# Patient Record
Sex: Male | Born: 1975 | Race: Black or African American | Hispanic: No | Marital: Single | Smoking: Never smoker
Health system: Southern US, Community
[De-identification: ages and names within clinical notes are randomized; demographics above are authoritative.]

## PROBLEM LIST (undated history)

## (undated) DIAGNOSIS — R569 Unspecified convulsions: Secondary | ICD-10-CM

## (undated) DIAGNOSIS — R42 Dizziness and giddiness: Secondary | ICD-10-CM

## (undated) DIAGNOSIS — R519 Headache, unspecified: Secondary | ICD-10-CM

## (undated) HISTORY — DX: Headache, unspecified: R51.9

## (undated) HISTORY — DX: Dizziness and giddiness: R42

---

## 2007-12-15 ENCOUNTER — Ambulatory Visit: Payer: Self-pay | Admitting: Family Medicine

## 2007-12-15 DIAGNOSIS — R002 Palpitations: Secondary | ICD-10-CM

## 2007-12-15 DIAGNOSIS — R569 Unspecified convulsions: Secondary | ICD-10-CM

## 2007-12-15 DIAGNOSIS — Z9189 Other specified personal risk factors, not elsewhere classified: Secondary | ICD-10-CM | POA: Insufficient documentation

## 2007-12-18 LAB — CONVERTED CEMR LAB
Basophils Absolute: 0 10*3/uL (ref 0.0–0.1)
CO2: 28 meq/L (ref 19–32)
Chloride: 102 meq/L (ref 96–112)
Cholesterol: 162 mg/dL (ref 0–200)
Eosinophils Absolute: 0.2 10*3/uL (ref 0.0–0.7)
Eosinophils Relative: 3.5 % (ref 0.0–5.0)
Hemoglobin: 17.2 g/dL — ABNORMAL HIGH (ref 13.0–17.0)
LDL Cholesterol: 118 mg/dL — ABNORMAL HIGH (ref 0–99)
MCV: 93.6 fL (ref 78.0–100.0)
Monocytes Absolute: 0.6 10*3/uL (ref 0.1–1.0)
Neutro Abs: 2.5 10*3/uL (ref 1.4–7.7)
Platelets: 210 10*3/uL (ref 150–400)
Potassium: 4.5 meq/L (ref 3.5–5.1)
RBC: 5.34 M/uL (ref 4.22–5.81)
Sodium: 140 meq/L (ref 135–145)
Total Bilirubin: 0.6 mg/dL (ref 0.3–1.2)
Total CHOL/HDL Ratio: 6
Triglycerides: 83 mg/dL (ref 0–149)
VLDL: 17 mg/dL (ref 0–40)
WBC: 5.1 10*3/uL (ref 4.5–10.5)

## 2009-12-15 ENCOUNTER — Emergency Department (HOSPITAL_COMMUNITY): Admission: EM | Admit: 2009-12-15 | Discharge: 2009-12-15 | Payer: Self-pay | Admitting: Emergency Medicine

## 2010-09-28 LAB — GC/CHLAMYDIA PROBE AMP, GENITAL
Chlamydia, DNA Probe: NEGATIVE
GC Probe Amp, Genital: NEGATIVE

## 2010-09-28 LAB — RPR: RPR Ser Ql: NONREACTIVE

## 2010-09-28 LAB — URINALYSIS, ROUTINE W REFLEX MICROSCOPIC
Bilirubin Urine: NEGATIVE
Glucose, UA: NEGATIVE mg/dL
Hgb urine dipstick: NEGATIVE
Ketones, ur: NEGATIVE mg/dL
Nitrite: NEGATIVE

## 2015-09-29 ENCOUNTER — Emergency Department (HOSPITAL_BASED_OUTPATIENT_CLINIC_OR_DEPARTMENT_OTHER)
Admission: EM | Admit: 2015-09-29 | Discharge: 2015-09-29 | Disposition: A | Discharge status: Home / Self Care | Payer: Self-pay | Attending: Emergency Medicine | Admitting: Emergency Medicine

## 2015-09-29 ENCOUNTER — Emergency Department (HOSPITAL_BASED_OUTPATIENT_CLINIC_OR_DEPARTMENT_OTHER): Payer: Self-pay

## 2015-09-29 ENCOUNTER — Encounter (HOSPITAL_BASED_OUTPATIENT_CLINIC_OR_DEPARTMENT_OTHER): Payer: Self-pay | Admitting: *Deleted

## 2015-09-29 DIAGNOSIS — Y998 Other external cause status: Secondary | ICD-10-CM | POA: Insufficient documentation

## 2015-09-29 DIAGNOSIS — Y92310 Basketball court as the place of occurrence of the external cause: Secondary | ICD-10-CM | POA: Insufficient documentation

## 2015-09-29 DIAGNOSIS — Y9367 Activity, basketball: Secondary | ICD-10-CM | POA: Insufficient documentation

## 2015-09-29 DIAGNOSIS — S93401A Sprain of unspecified ligament of right ankle, initial encounter: Secondary | ICD-10-CM | POA: Insufficient documentation

## 2015-09-29 DIAGNOSIS — X501XXA Overexertion from prolonged static or awkward postures, initial encounter: Secondary | ICD-10-CM | POA: Insufficient documentation

## 2015-09-29 HISTORY — DX: Unspecified convulsions: R56.9

## 2015-09-29 MED ORDER — NAPROXEN 500 MG PO TABS: 500.0000 mg | ORAL_TABLET | Freq: Two times a day (BID) | ORAL | Status: DC

## 2015-09-29 MED ORDER — IBUPROFEN 800 MG PO TABS
800.0000 mg | ORAL_TABLET | Freq: Once | ORAL | Status: AC
  Administered 2015-09-29: 800 mg via ORAL
  Filled 2015-09-29: qty 1

## 2015-09-29 NOTE — ED Notes (Signed)
Pt tolerated well. CMS intact before and after. Pt understood how to use crutches properly. No questions from pt.

## 2015-09-29 NOTE — ED Notes (Signed)
[facility]

## 2015-09-29 NOTE — ED Provider Notes (Signed)
CSN: [id number]     Arrival date & time 09/29/15  0421 History   First MD Initiated Contact with Patient 09/29/15 [phone]     Chief Complaint  Patient presents with  . Ankle Pain     (Consider location/radiation/quality/duration/timing/severity/associated sxs/prior Treatment) Patient is a 40 y.o. male presenting with ankle pain. The history is provided by the patient.  Ankle Pain Location:  Ankle Time since incident:  1 day Injury: yes   Mechanism of injury comment:  Twisted it playing basketball Ankle location:  R ankle Pain details:    Quality:  Aching   Radiates to:  Does not radiate   Severity:  Severe   Onset quality:  Sudden   Timing:  Constant   Progression:  Unchanged Chronicity:  New Dislocation: no   Foreign body present:  No foreign bodies Prior injury to area:  No Relieved by:  Nothing Worsened by:  Nothing tried Ineffective treatments:  None tried Associated symptoms: no back pain, no muscle weakness, no numbness and no stiffness   Risk factors: no concern for non-accidental trauma     Past Medical History  Diagnosis Date  . Seizures (HCC)    History reviewed. No pertinent past surgical history. History reviewed. No pertinent family history. Social History  Substance Use Topics  . Smoking status: Never Smoker   . Smokeless tobacco: None  . Alcohol Use: Yes     Comment: occ    Review of Systems  Musculoskeletal: Negative for back pain and stiffness.  All other systems reviewed and are negative.     Allergies  Review of patient's allergies indicates no known allergies.  Home Medications   Prior to Admission medications   Not on File   BP 110/65 mmHg  Pulse 82  Temp(Src) 99 F (37.2 C) (Oral)  Resp 16  Ht  (1.753 m)  Wt 180 lb (81.647 kg)  BMI 26.57 kg/m2  SpO2 99% Physical Exam  Constitutional: He is oriented to person, place, and time. He appears well-developed and well-nourished. No distress.  HENT:  Head: Normocephalic and  atraumatic.  Mouth/Throat: Oropharynx is clear and moist.  Eyes: Pupils are equal, round, and reactive to light.  Neck: Normal range of motion. Neck supple.  Cardiovascular: Normal rate, regular rhythm and intact distal pulses.   Pulmonary/Chest: Effort normal and breath sounds normal. No respiratory distress. He has no wheezes. He has no rales.  Abdominal: Soft. Bowel sounds are normal. There is no tenderness. There is no rebound and no guarding.  Musculoskeletal: Normal range of motion.       Right ankle: He exhibits normal range of motion, no swelling, no ecchymosis, no deformity, no laceration and normal pulse. Tenderness. AITFL tenderness found. No medial malleolus, no CF ligament, no posterior TFL, no head of 5th metatarsal and no proximal fibula tenderness found. Achilles tendon normal.       Right foot: Normal.  Neurological: He is alert and oriented to person, place, and time.  Skin: Skin is warm and dry.  Psychiatric: He has a normal mood and affect.    ED Course  Procedures (including critical care time) Labs Review Labs Reviewed - No data to display  Imaging Review No results found. I have personally reviewed and evaluated these images and lab results as part of my medical decision-making.   EKG Interpretation None      MDM   Final diagnoses:  None   Ankle Sprain.  ASO crutches ice elevation and NSAIDS.  Cy BlamerApril Isobel Eisenhuth, MD 09/29/15 [phone]

## 2015-09-29 NOTE — Discharge Instructions (Signed)
Ankle Sprain °An ankle sprain is an injury to the strong, fibrous tissues (ligaments) that hold the bones of your ankle joint together.  °CAUSES °An ankle sprain is usually caused by a fall or by twisting your ankle. Ankle sprains most commonly occur when you step on the outer edge of your foot, and your ankle turns inward. People who participate in sports are more prone to these types of injuries.  °SYMPTOMS  °· Pain in your ankle. The pain may be present at rest or only when you are trying to stand or walk. °· Swelling. °· Bruising. Bruising may develop immediately or within 1 to 2 days after your injury. °· Difficulty standing or walking, particularly when turning corners or changing directions. °DIAGNOSIS  °Your caregiver will ask you details about your injury and perform a physical exam of your ankle to determine if you have an ankle sprain. During the physical exam, your caregiver will press on and apply pressure to specific areas of your foot and ankle. Your caregiver will try to move your ankle in certain ways. An X-ray exam may be done to be sure a bone was not broken or a ligament did not separate from one of the bones in your ankle (avulsion fracture).  °TREATMENT  °Certain types of braces can help stabilize your ankle. Your caregiver can make a recommendation for this. Your caregiver may recommend the use of medicine for pain. If your sprain is severe, your caregiver may refer you to a surgeon who helps to restore function to parts of your skeletal system (orthopedist) or a physical therapist. °HOME CARE INSTRUCTIONS  °· Apply ice to your injury for 1-2 days or as directed by your caregiver. Applying ice helps to reduce inflammation and pain. °· Put ice in a plastic bag. °· Place a towel between your skin and the bag. °· Leave the ice on for 15-20 minutes at a time, every 2 hours while you are awake. °· Only take over-the-counter or prescription medicines for pain, discomfort, or fever as directed by  your caregiver. °· Elevate your injured ankle above the level of your heart as much as possible for 2-3 days. °· If your caregiver recommends crutches, use them as instructed. Gradually put weight on the affected ankle. Continue to use crutches or a cane until you can walk without feeling pain in your ankle. °· If you have a plaster splint, wear the splint as directed by your caregiver. Do not rest it on anything harder than a pillow for the first 24 hours. Do not put weight on it. Do not get it wet. You may take it off to take a shower or bath. °· You may have been given an elastic bandage to wear around your ankle to provide support. If the elastic bandage is too tight (you have numbness or tingling in your foot or your foot becomes cold and blue), adjust the bandage to make it comfortable. °· If you have an air splint, you may blow more air into it or let air out to make it more comfortable. You may take your splint off at night and before taking a shower or bath. Wiggle your toes in the splint several times per day to decrease swelling. °SEEK MEDICAL CARE IF:  °· You have rapidly increasing bruising or swelling. °· Your toes feel extremely cold or you lose feeling in your foot. °· Your pain is not relieved with medicine. °SEEK IMMEDIATE MEDICAL CARE IF: °· Your toes are numb or blue. °·   You have severe pain that is increasing. °MAKE SURE YOU:  °· Understand these instructions. °· Will watch your condition. °· Will get help right away if you are not doing well or get worse. °  °This information is not intended to replace advice given to you by your health care provider. Make sure you discuss any questions you have with your health care provider. °  °Document Released: 06/28/2005 Document Revised: 07/19/2014 Document Reviewed: 07/10/2011 °Elsevier Interactive Patient Education ©2016 [employer] ° °Cryotherapy °Cryotherapy means treatment with cold. Ice or gel packs can be used to reduce both pain and swelling.  Ice is the most helpful within the first 24 to 48 hours after an injury or flare-up from overusing a muscle or joint. Sprains, strains, spasms, burning pain, shooting pain, and aches can all be eased with ice. Ice can also be used when recovering from surgery. Ice is effective, has very few side effects, and is safe for most people to use. °PRECAUTIONS  °Ice is not a safe treatment option for people with: °· Raynaud phenomenon. This is a condition affecting small blood vessels in the extremities. Exposure to cold may cause your problems to return. °· Cold hypersensitivity. There are many forms of cold hypersensitivity, including: °¨ Cold urticaria. Red, itchy hives appear on the skin when the tissues begin to warm after being iced. °¨ Cold erythema. This is a red, itchy rash caused by exposure to cold. °¨ Cold hemoglobinuria. Red blood cells break down when the tissues begin to warm after being iced. The hemoglobin that carry oxygen are passed into the urine because they cannot combine with blood proteins fast enough. °· Numbness or altered sensitivity in the area being iced. °If you have any of the following conditions, do not use ice until you have discussed cryotherapy with your caregiver: °· Heart conditions, such as arrhythmia, angina, or chronic heart disease. °· High blood pressure. °· Healing wounds or open skin in the area being iced. °· Current infections. °· Rheumatoid arthritis. °· Poor circulation. °· Diabetes. °Ice slows the blood flow in the region it is applied. This is beneficial when trying to stop inflamed tissues from spreading irritating chemicals to surrounding tissues. However, if you expose your skin to cold temperatures for too long or without the proper protection, you can damage your skin or nerves. Watch for signs of skin damage due to cold. °HOME CARE INSTRUCTIONS °Follow these tips to use ice and cold packs safely. °· Place a dry or damp towel between the ice and skin. A damp towel will  cool the skin more quickly, so you may need to shorten the time that the ice is used. °· For a more rapid response, add gentle compression to the ice. °· Ice for no more than 10 to 20 minutes at a time. The bonier the area you are icing, the less time it will take to get the benefits of ice. °· Check your skin after 5 minutes to make sure there are no signs of a poor response to cold or skin damage. °· Rest 20 minutes or more between uses. °· Once your skin is numb, you can end your treatment. You can test numbness by very lightly touching your skin. The touch should be so light that you do not see the skin dimple from the pressure of your fingertip. When using ice, most people will feel these normal sensations in this order: cold, burning, aching, and numbness. °· Do not use ice on someone who   cannot communicate their responses to pain, such as small children or people with dementia. °HOW TO MAKE AN ICE PACK °Ice packs are the most common way to use ice therapy. Other methods include ice massage, ice baths, and cryosprays. Muscle creams that cause a cold, tingly feeling do not offer the same benefits that ice offers and should not be used as a substitute unless recommended by your caregiver. °To make an ice pack, do one of the following: °· Place crushed ice or a bag of frozen vegetables in a sealable plastic bag. Squeeze out the excess air. Place this bag inside another plastic bag. Slide the bag into a pillowcase or place a damp towel between your skin and the bag. °· Mix 3 parts water with 1 part rubbing alcohol. Freeze the mixture in a sealable plastic bag. When you remove the mixture from the freezer, it will be slushy. Squeeze out the excess air. Place this bag inside another plastic bag. Slide the bag into a pillowcase or place a damp towel between your skin and the bag. °SEEK MEDICAL CARE IF: °· You develop white spots on your skin. This may give the skin a blotchy (mottled) appearance. °· Your skin turns  blue or pale. °· Your skin becomes waxy or hard. °· Your swelling gets worse. °MAKE SURE YOU:  °· Understand these instructions. °· Will watch your condition. °· Will get help right away if you are not doing well or get worse. °  °This information is not intended to replace advice given to you by your health care provider. Make sure you discuss any questions you have with your health care provider. °  °Document Released: 02/22/2011 Document Revised: 07/19/2014 Document Reviewed: 02/22/2011 °Elsevier Interactive Patient Education ©2016 [employer]vier Inc. ° °

## 2015-09-29 NOTE — ED Notes (Signed)
C/o R akle pain and swelling, rolled ankle yesterday playing basketball, c/o circumfrential ankle pain, no meds PTA, CMS and ROM intact. Has tried ice, heat and epsom salts. States, "Need crutches to work". Swelling present, skin W&D.

## 2015-09-29 NOTE — ED Notes (Signed)
Pt to xray via stretcher

## 2016-09-13 IMAGING — DX DG ANKLE COMPLETE 3+V*R*
3 series · 3 of 3 positions shown · non-contrast
Comparison: None.

CLINICAL DATA: Pain and swelling after trauma

EXAM:
RIGHT ANKLE - COMPLETE 3+ VIEW

[ankle ap]
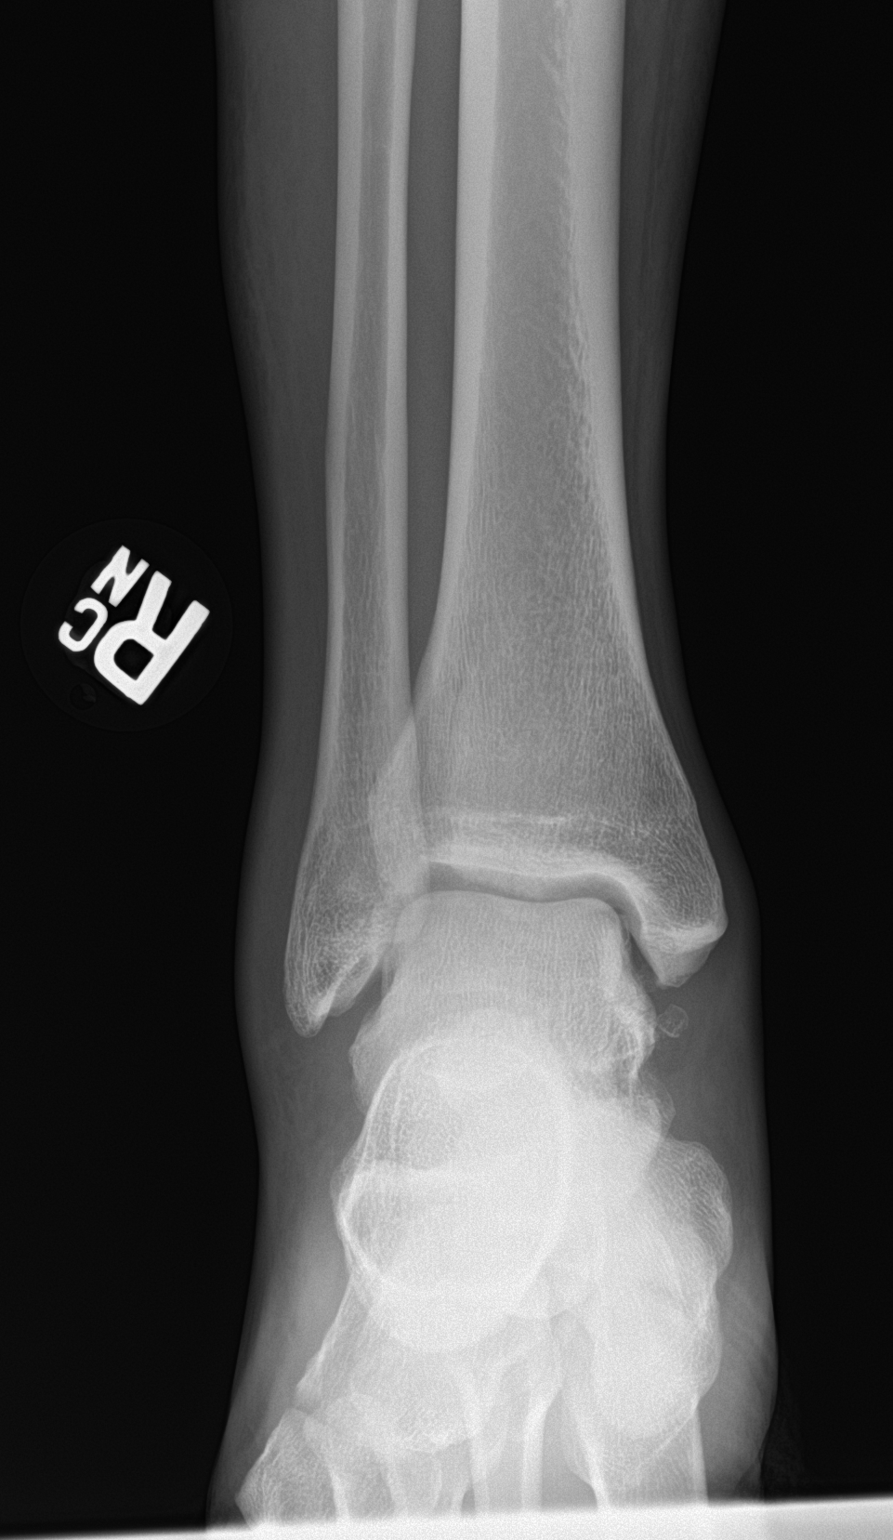

[ankle obl]
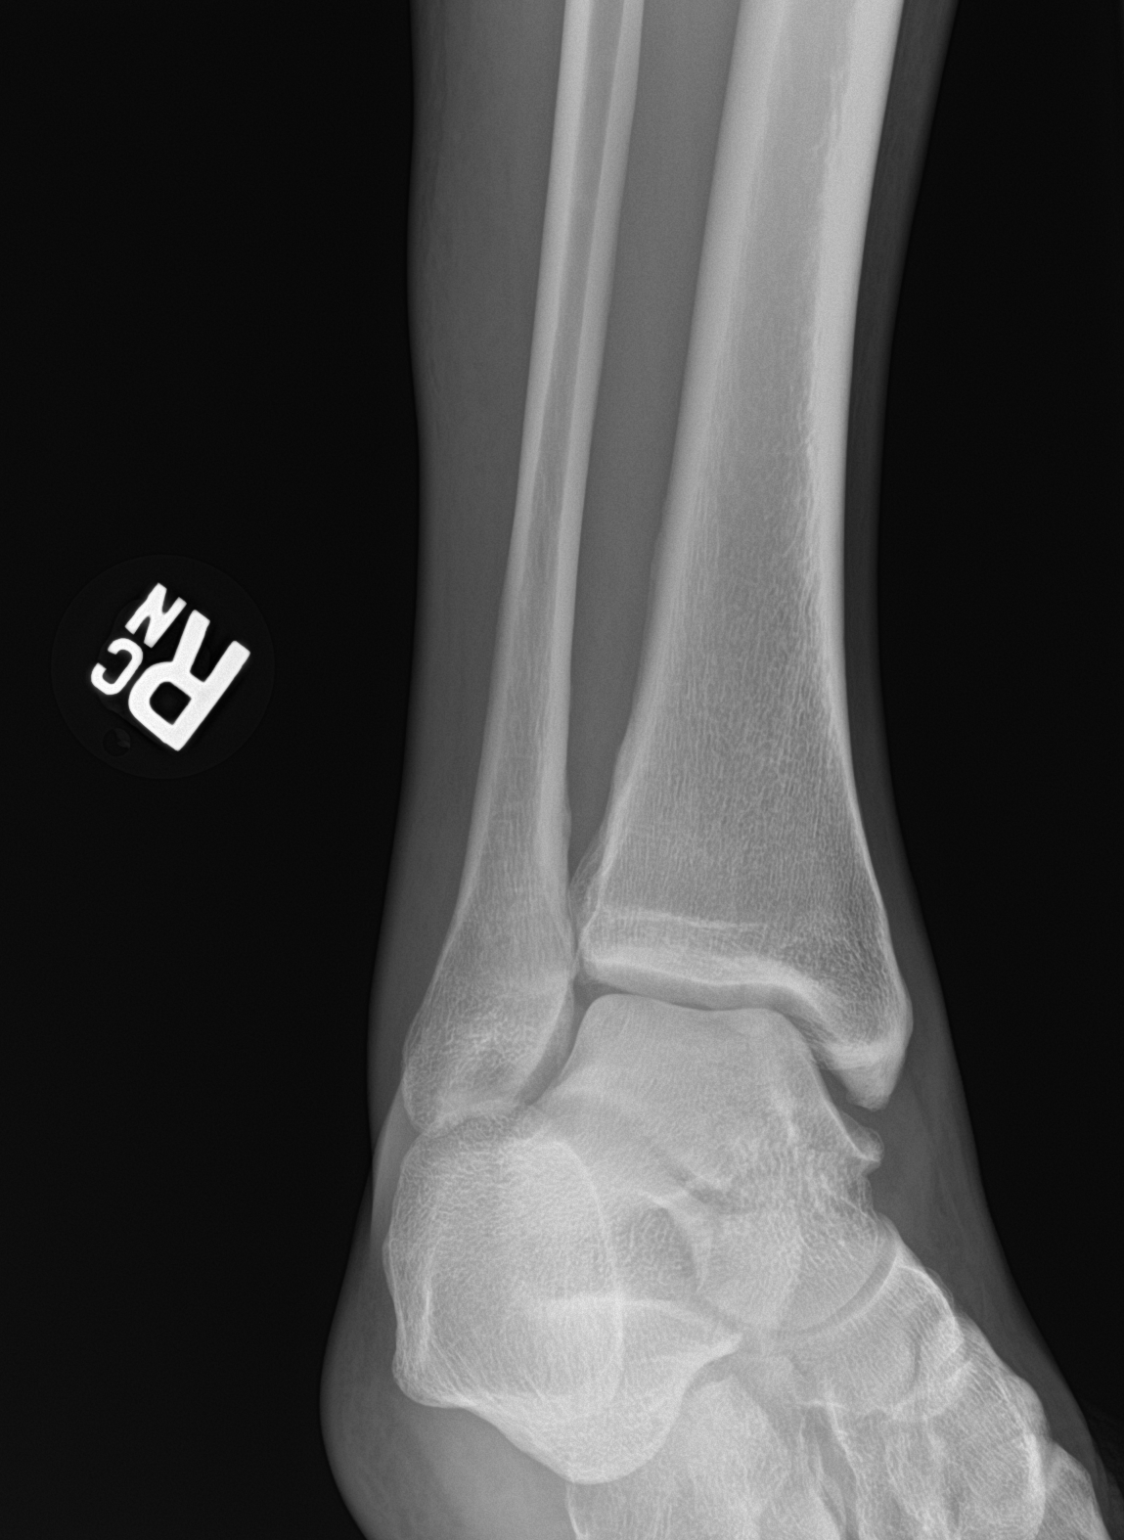

[ankle lat]
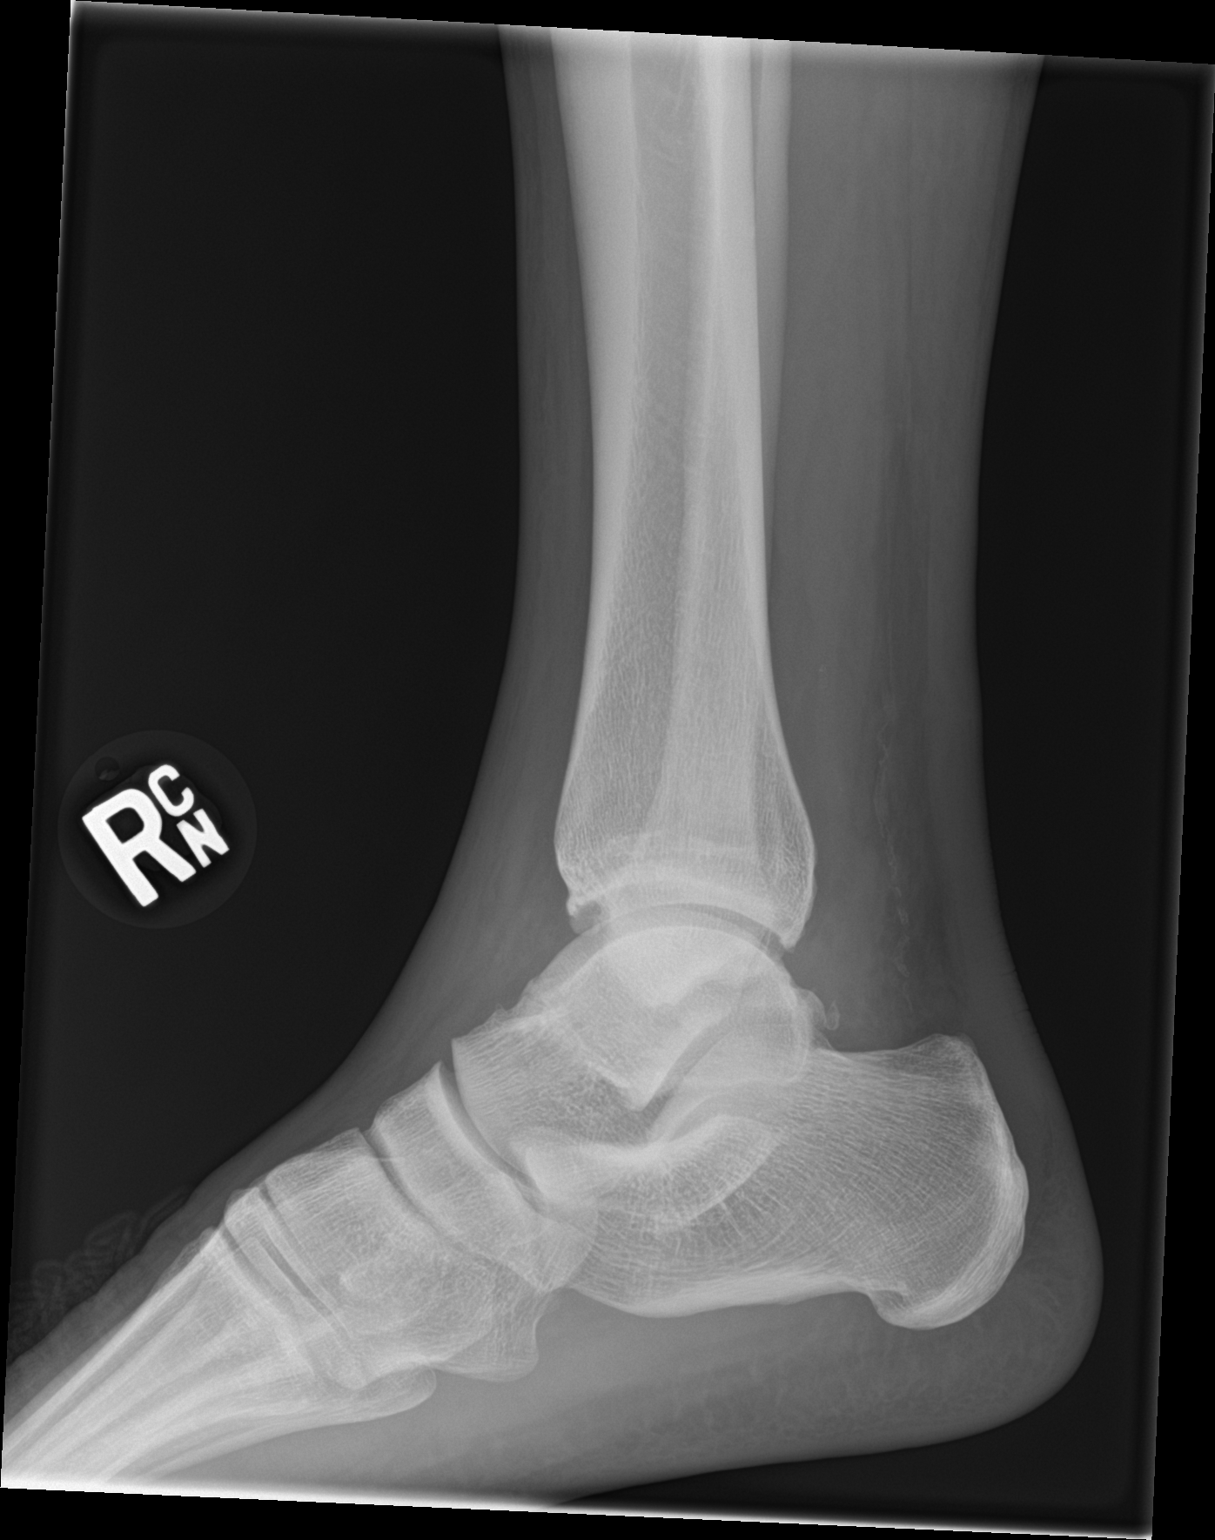

[3 of 3 positions shown; findings below may reference images not displayed]

FINDINGS: There is no evidence of fracture, dislocation, or joint effusion.
There is no evidence of arthropathy or other focal bone abnormality.
Soft tissues are unremarkable.
IMPRESSION: Negative.

## 2017-09-28 ENCOUNTER — Other Ambulatory Visit: Payer: Self-pay

## 2017-09-28 ENCOUNTER — Emergency Department (HOSPITAL_BASED_OUTPATIENT_CLINIC_OR_DEPARTMENT_OTHER)
Admission: EM | Admit: 2017-09-28 | Discharge: 2017-09-28 | Disposition: A | Discharge status: Home / Self Care | Payer: Self-pay | Attending: Emergency Medicine | Admitting: Emergency Medicine

## 2017-09-28 ENCOUNTER — Encounter (HOSPITAL_BASED_OUTPATIENT_CLINIC_OR_DEPARTMENT_OTHER): Payer: Self-pay

## 2017-09-28 DIAGNOSIS — R300 Dysuria: Secondary | ICD-10-CM | POA: Insufficient documentation

## 2017-09-28 DIAGNOSIS — G40909 Epilepsy, unspecified, not intractable, without status epilepticus: Secondary | ICD-10-CM | POA: Insufficient documentation

## 2017-09-28 DIAGNOSIS — Z79899 Other long term (current) drug therapy: Secondary | ICD-10-CM | POA: Insufficient documentation

## 2017-09-28 LAB — URINALYSIS, ROUTINE W REFLEX MICROSCOPIC
Bilirubin Urine: NEGATIVE
GLUCOSE, UA: NEGATIVE mg/dL
Hgb urine dipstick: NEGATIVE
KETONES UR: 15 mg/dL — AB
LEUKOCYTES UA: NEGATIVE
NITRITE: NEGATIVE
PH: 8.5 — AB (ref 5.0–8.0)
PROTEIN: 30 mg/dL — AB
Specific Gravity, Urine: 1.015 (ref 1.005–1.030)

## 2017-09-28 LAB — URINALYSIS, MICROSCOPIC (REFLEX): RBC / HPF: NONE SEEN RBC/hpf (ref 0–5)

## 2017-09-28 NOTE — ED Triage Notes (Signed)
Pt states he feels like he is going to have a seizure-NAD-steady gait-pt states he stopped taking his seizure med because he thinks he is having "negative reactions-real scared and shaky"

## 2017-09-28 NOTE — ED Provider Notes (Signed)
[facility] EMERGENCY DEPARTMENT Provider Note   CSN: [id number] Arrival date & time: 09/28/17  1330     History   Chief Complaint Chief Complaint  Patient presents with  . Seizures    HPI Eduardo Moreno is a 42 y.o. male.  HPI 42 year old African-American male past medical history significant for seizure disorder presents to the emergency department with very vague complaints.  Patient states that he stopped his seizure medication 2 days ago because "he thinks he is allergic to it because it makes him feel real scared and shaky".  Patient states that he feels like he might have a seizure.  Patient states that he has been on seizure medication for the past 10 years.  States that he stopped this 2 days ago due to his reaction.  Patient has not followed up with his primary care doctor.  States he has an appointment in a couple weeks.  The patient states he does not see a neurologist for his seizures.  Patient states that "I feel all funny in my head".  Patient also starts complaining that he is worried about his prostate he may have prostate cancer that spread to his head.  Patient takes "I have seen a prostate doctor in the past that was normal workup".  Patient states he is very worried that he has cancer somewhere and that "something just is not right.  Patient reports that he does have some burning when he pees intermittently that is been going on for several months to years.  He denies any associated penile discharge, testicular pain or swelling, abdominal pain.  Denies any associated fevers, chills.  Denies any pain with defecation.  Denies any blood in his stools.  Of note patient was seen on 3/15 for same symptoms with associated dizziness and confusion.  At that time he had a very thorough workup that included blood work, urine, UDS, CT of head, MRI of head, EKG and chest x-ray that was unremarkable for any acute findings.  The diagnosis of vertigo and put him on meclizine.   Patient states that his dizziness has improved.  He states that his symptoms do seem to be improving after stopping the seizure medication.    States that he does not have insurance and cannot go see any specialist.  Pt denies any fever, chill, ha, vision changes, lightheadedness, dizziness, congestion, neck pain, cp, sob, cough, abd pain, n/v/d, change in bowel habits, melena, hematochezia, lower extremity paresthesias.  Past Medical History:  Diagnosis Date  . Seizures [facility])     Patient Active Problem List   Diagnosis Date Noted  . SEIZURE DISORDER 12/15/2007  . PALPITATIONS 12/15/2007  . CHICKENPOX, HX OF 12/15/2007    History reviewed. No pertinent surgical history.     Home Medications    Prior to Admission medications   Medication Sig Start Date End Date Taking? Authorizing Provider  carbamazepine (TEGRETOL) 200 MG tablet Take 200 mg by mouth 3 (three) times daily.   Yes [provider]    Family History No family history on file.  Social History Social History   Tobacco Use  . Smoking status: Never Smoker  . Smokeless tobacco: Never Used  Substance Use Topics  . Alcohol use: Yes    Comment: occ  . Drug use: No     Allergies   Patient has no known allergies.   Review of Systems Review of Systems  All other systems reviewed and are negative.    Physical Exam  Updated Vital Signs BP 117/89 (BP Location: Left Arm)   Pulse 68   Temp 98.6 F (37 C) (Oral)   Resp 16   Ht 5\' 10"  (1.778 m)   Wt 81.6 kg (179 lb 12.8 oz)   SpO2 98%   BMI 25.80 kg/m   Physical Exam  Constitutional: He is oriented to person, place, and time. He appears well-developed and well-nourished.  Non-toxic appearance. No distress.  HENT:  Head: Normocephalic and atraumatic.  Mouth/Throat: Oropharynx is clear and moist.  Eyes: Conjunctivae and EOM are normal. Pupils are equal, round, and reactive to light. Right eye exhibits no discharge. Left eye exhibits no  discharge.  Neck: Normal range of motion. Neck supple.  Cardiovascular: Normal rate, regular rhythm, normal heart sounds and intact distal pulses. Exam reveals no gallop and no friction rub.  No murmur heard. Pulmonary/Chest: Effort normal and breath sounds normal. No stridor. No respiratory distress. He has no wheezes. He has no rales. He exhibits no tenderness.  Abdominal: Soft. Bowel sounds are normal. He exhibits no distension. There is no tenderness. There is no rebound and no guarding.  Genitourinary:  Genitourinary Comments: Rectal exam was performed with chaperone to assess for prostate.  There is no bogginess of the prostate.  It is very uniform in nature.  There is no tenderness to palpation.  No significant enlarged prostate noted.  There is no erythema around the rectum or area of fluctuance concerning for a perirectal abscess.  Stool is brown and atrial any gross melena or hematochezia.  Musculoskeletal: Normal range of motion. He exhibits no tenderness.  Lymphadenopathy:    He has no cervical adenopathy.  Neurological: He is alert and oriented to person, place, and time.  The patient is alert, attentive, and oriented x 3. Speech is clear. Cranial nerve II-VII grossly intact. Negative pronator drift. Sensation intact. Strength 5/5 in all extremities. Reflexes 2+ and symmetric at biceps, triceps, knees, and ankles. Rapid alternating movement and fine finger movements intact. Romberg is absent. Posture and gait normal.   Skin: Skin is warm and dry. Capillary refill takes less than 2 seconds. No rash noted.  Psychiatric: His behavior is normal. Judgment and thought content normal.  Nursing note and vitals reviewed.    ED Treatments / Results  Labs (all labs ordered are listed, but only abnormal results are displayed) Labs Reviewed  URINALYSIS, ROUTINE W REFLEX MICROSCOPIC - Abnormal; Notable for the following components:      Result Value   APPearance CLOUDY (*)    pH 8.5 (*)      Ketones, ur 15 (*)    Protein, ur 30 (*)    All other components within normal limits  URINALYSIS, MICROSCOPIC (REFLEX) - Abnormal; Notable for the following components:   Bacteria, UA FEW (*)    Squamous Epithelial / LPF 0-5 (*)    All other components within normal limits  URINE CULTURE    EKG  EKG Interpretation None       Radiology No results found.  Procedures Procedures (including critical care time)  Medications Ordered in ED Medications - No data to display   Initial Impression / Assessment and Plan / ED Course  I have reviewed the triage vital signs and the nursing notes.  Pertinent labs & imaging results that were available during my care of the patient were reviewed by me and considered in my medical decision making (see chart for details).     Patient resents to the emergency department today  for vague complaints of reaction to his seizure medication including making him feel scared and confused.  He also reports that he thinks he may have prostate cancer that has spread to his head.  Patient seems very anxious.  Of note patient had a very thorough workup on 3/15 for similar symptoms at the ED.  At that time he had an MRI and CT of head that was normal.  They diagnosed him with vertigo.  Patient is asked today that I evaluate his prostate patient is not prostate cancer.  States that he does have a history of seeing a prostate doctor.  On my exam patient's prostate is uniform without any signs of significant tenderness.  UA with rare bacteria but no other signs of infection.  Have sent for culture.  Doubt acute prostatitis.  Discussed the patient that I do not know that if he does not have prostate cancer that he is to follow-up with his primary care doctor to have further test preformed.  Patient has no focal neuro deficit on exam.  I review patient's imaging and workup from the hospital 5 days ago.  Do not feel that further imaging or workup including blood work is  indicated at this time.  I encouraged with patient that stopping his medication for his seizure disorder is probably not the best thing to do at this time.  States that this may cause him to have a seizure.  I discussed that I would start taking the medication and follow-up with his primary care doctor this week.  I do not feel the patient has any further emergent medical condition that would require intervention at this time.  Again thorough workup done 5 days ago in the ED for similar symptoms.  Give referral to urology and neurology.  Pt is hemodynamically stable, in NAD, & able to ambulate in the ED. Evaluation does not show pathology that would require ongoing emergent intervention or inpatient treatment. I explained the diagnosis to the patient. Pain has been managed & has no complaints prior to dc. Pt is comfortable with above plan and is stable for discharge at this time. All questions were answered prior to disposition. Strict return precautions for f/u to the ED were discussed. Encouraged follow up with PCP.   Final Clinical Impressions(s) / ED Diagnoses   Final diagnoses:  Seizure disorder [facility])  Dysuria    ED Discharge Orders        Ordered    Ambulatory referral to Neurology    Comments:  An appointment is requested in approximately: hx of seizures, no neurologist, on seizure meds that he wants to stop   09/28/17 1541       Rise Mu, PA-C 09/28/17 1947    Vanetta Mulders, MD 09/30/17 1520

## 2017-09-28 NOTE — ED Notes (Addendum)
States," I stopped my seizure medicine x 2 days ago because I think I might be allergic to it" Has been on med x 10 years, also worried about prostate and that he could have prostate cancer and it could spread to his head. Denies any rash, itching or obvious s/s of allergic reaction.

## 2017-09-28 NOTE — Discharge Instructions (Signed)
Your urine did not show any signs of infection.  Have sent for urine culture.  I would suggest that you continue taking your seizure medications and follow-up with your primary care doctor this week.  Stopping your seizure medications will cause you to have a seizure.  Given you follow-up with urologist concerning your prostate concerns.  Return the ED with any worsening symptoms.

## 2017-09-29 LAB — URINE CULTURE: CULTURE: NO GROWTH

## 2017-10-01 ENCOUNTER — Encounter (HOSPITAL_COMMUNITY): Payer: Self-pay | Admitting: *Deleted

## 2017-10-01 ENCOUNTER — Other Ambulatory Visit: Payer: Self-pay

## 2017-10-01 ENCOUNTER — Emergency Department (HOSPITAL_COMMUNITY)
Admission: EM | Admit: 2017-10-01 | Discharge: 2017-10-01 | Disposition: A | Discharge status: Home / Self Care | Payer: Self-pay | Attending: Emergency Medicine | Admitting: Emergency Medicine

## 2017-10-01 DIAGNOSIS — Z79899 Other long term (current) drug therapy: Secondary | ICD-10-CM | POA: Insufficient documentation

## 2017-10-01 DIAGNOSIS — N4889 Other specified disorders of penis: Secondary | ICD-10-CM | POA: Insufficient documentation

## 2017-10-01 MED ORDER — CIPROFLOXACIN HCL 500 MG PO TABS: 500.0000 mg | ORAL_TABLET | Freq: Two times a day (BID) | ORAL | 0 refills | Status: DC

## 2017-10-01 NOTE — Discharge Instructions (Addendum)
Your evaluated in the emergency department for concerns of prostatitis.  It sounds like these symptoms have been intermittent but chronic and your recent lab work and doctor's visits have not find an obvious cause for her symptoms.  We sent off tests for gonorrhea and chlamydia and we will call you if you need to be treated for that if they are positive.  I am also re-prescribing you Cipro to your primary care doctor wanted to started on in case they did not call in a prescription.  We are also giving you a number for urology so you can be evaluated by them.

## 2017-10-01 NOTE — ED Triage Notes (Signed)
Pt has several complaints: reports "I feel like I'm having an allergic reaction to my seizure medications. I feel nervous and anxious." Pt has been on same seizure medicaiton for 14 years. Pt was seen yesterday by PCP for a rectal exam, reports feeling irritation around prostate. PCP checked urine for UTI and placed pt on antibiotics for prostatitis. Also c/o mucus in BM that started yesterday

## 2017-10-01 NOTE — ED Provider Notes (Signed)
[facility] Provider Note   CSN: [id number] Arrival date & time: 10/01/17  0608     History   Chief Complaint Chief Complaint  Patient presents with  . Medication Reaction  . Rectal Pain    HPI Eduardo Moreno is a 42 y.o. male.  He is complaining of various symptoms including feeling more anxious and on edge.  Sounds like this is been going on for a long time but is becoming more frequent.  There is a lot of his anxiety resolves around some some physical complaints.  Notices some discomfort at the base of his penis when he urinates.  States his been treated for prostatitis multiple times and just saw his PCP and started on Cipro.  He has been unable to fill this because the prescription was not at the pharmacy.  denies any fever he said he has had some 10 pound intentional weight loss with exercise and dieting.  He still able to urinate easily and has had no difficulty with erection or ejaculation.  He has not noticed any sores.  He is also complaining of some mucus in his bowel movements.  He has been going on and off for a while but were more frequently over the last few weeks.  He is on Tegretol for seizures and his last seizure was 2 years ago when he has not had any med adjustments.  He also had a level drawn within the week.  No other medication changes.  HPI  Past Medical History:  Diagnosis Date  . Seizures [facility](HCC)     Patient Active Problem List   Diagnosis Date Noted  . SEIZURE DISORDER 12/15/2007  . PALPITATIONS 12/15/2007  . CHICKENPOX, HX OF 12/15/2007    History reviewed. No pertinent surgical history.      Home Medications    Prior to Admission medications   Medication Sig Start Date End Date Taking? Authorizing Provider  carbamazepine (TEGRETOL) 200 MG tablet Take 200 mg by mouth 3 (three) times daily.    [provider]    Family History No family history on file.  Social History Social History    Tobacco Use  . Smoking status: Never Smoker  . Smokeless tobacco: Never Used  Substance Use Topics  . Alcohol use: Yes    Comment: occ  . Drug use: No     Allergies   Patient has no known allergies.   Review of Systems Review of Systems  Constitutional: Negative for chills, fever and unexpected weight change.  HENT: Negative for hearing loss and sore throat.   Eyes: Negative for pain and visual disturbance.  Respiratory: Negative for cough and chest tightness.   Cardiovascular: Negative for chest pain and leg swelling.  Gastrointestinal: Positive for rectal pain. Negative for abdominal pain, anal bleeding, blood in stool, nausea and vomiting.  Genitourinary: Positive for penile pain. Negative for dysuria, frequency, penile swelling and testicular pain.  Musculoskeletal: Negative for gait problem and neck pain.  Neurological: Positive for seizures. Negative for syncope and headaches.  Psychiatric/Behavioral: Negative for hallucinations. The patient is nervous/anxious.      Physical Exam Updated Vital Signs BP (!) 156/85 (BP Location: Right Arm)   Pulse 94   Temp 98.1 F (36.7 C) (Oral)   Resp 16   SpO2 98%   Physical Exam  Constitutional: He appears well-developed and well-nourished.  HENT:  Head: Normocephalic and atraumatic.  Eyes: Conjunctivae are normal.  Neck: Neck supple.  Cardiovascular: Normal rate  and regular rhythm.  No murmur heard. Pulmonary/Chest: Effort normal and breath sounds normal. No respiratory distress.  Abdominal: Soft. There is no tenderness. Hernia confirmed negative in the right inguinal area and confirmed negative in the left inguinal area.  Genitourinary: Testes normal. Cremasteric reflex is present. Right testis shows no mass and no tenderness. Left testis shows no mass and no tenderness. Circumcised. Penile tenderness (mild base) present. No phimosis or paraphimosis. No discharge found.  Musculoskeletal: He exhibits no edema.   Neurological: He is alert.  Skin: Skin is warm and dry.  Psychiatric: He has a normal mood and affect.  Nursing note and vitals reviewed.    ED Treatments / Results  Labs (all labs ordered are listed, but only abnormal results are displayed) Labs Reviewed  GC/CHLAMYDIA PROBE AMP ([place]) NOT AT [facility]    EKG None  Radiology No results found.  Procedures Procedures (including critical care time)  Medications Ordered in ED Medications - No data to display   Initial Impression / Assessment and Plan / ED Course  I have reviewed the triage vital signs and the nursing notes.  Pertinent labs & imaging results that were available during my care of the patient were reviewed by me and considered in my medical decision making (see chart for details).  Clinical Course as of Oct 03 1299  Sat Oct 01, 2017  0732 Patient with various complaints of anxiousness and concern with various pelvic complaints.  I find nothing on physical exam and his recent testing so far has tested negative.  I offered him testing for gonorrhea chlamydia and is accepted and is giving a sample.  His primary care doctor also wanted him on Cipro and is been able to pick it up so we will give him a paper prescription in case the office forgot to call it in.  I am also offering him a urologist to follow-up with as I think he either needs a more involved workup or at least reassurance from a specialist.   [MB]    Clinical Course User Index [MB] Terrilee Files, MD    Final Clinical Impressions(s) / ED Diagnoses   Final diagnoses:  Penile pain    ED Discharge Orders        Ordered    ciprofloxacin (CIPRO) 500 MG tablet  2 times daily     10/01/17 0734       Terrilee Files, MD 10/03/17 1302

## 2017-10-03 LAB — GC/CHLAMYDIA PROBE AMP (~~LOC~~) NOT AT ARMC
CHLAMYDIA, DNA PROBE: NEGATIVE
NEISSERIA GONORRHEA: NEGATIVE

## 2017-11-13 ENCOUNTER — Emergency Department (HOSPITAL_COMMUNITY)
Admission: EM | Admit: 2017-11-13 | Discharge: 2017-11-13 | Disposition: A | Discharge status: Home / Self Care | Payer: Self-pay | Attending: Emergency Medicine | Admitting: Emergency Medicine

## 2017-11-13 ENCOUNTER — Other Ambulatory Visit: Payer: Self-pay

## 2017-11-13 ENCOUNTER — Encounter (HOSPITAL_COMMUNITY): Payer: Self-pay | Admitting: Emergency Medicine

## 2017-11-13 DIAGNOSIS — Z79899 Other long term (current) drug therapy: Secondary | ICD-10-CM | POA: Insufficient documentation

## 2017-11-13 DIAGNOSIS — R002 Palpitations: Secondary | ICD-10-CM | POA: Insufficient documentation

## 2017-11-13 LAB — CARBAMAZEPINE LEVEL, TOTAL: Carbamazepine Lvl: 4.8 ug/mL (ref 4.0–12.0)

## 2017-11-13 LAB — I-STAT TROPONIN, ED: TROPONIN I, POC: 0 ng/mL (ref 0.00–0.08)

## 2017-11-13 NOTE — ED Triage Notes (Signed)
Pt. Stated, I think I might be in a panic mode I feel all nervous inside since yesterday morning

## 2017-11-13 NOTE — Discharge Instructions (Addendum)
Your evaluated in the emergency department for an episode of rapid heart rate and feeling anxious.  We did an EKG here along with a heart enzyme that showed no signs of heart injury.  We also checked your carbamazepine level and that was normal.  There is a vitamin D level pending that was drawn at your request.  You will need to follow-up with your primary care doctor for further evaluation and management.

## 2017-11-13 NOTE — ED Provider Notes (Signed)
[name] EMERGENCY DEPARTMENT Provider Note   CSN: [id number] Arrival date & time: 11/13/17  4696     History   Chief Complaint Chief Complaint  Patient presents with  . Panic Attack    HPI Eduardo Moreno is a 42 y.o. male.  Patient has complaint of waking up yesterday morning feeling very anxious and tremulous and had a rapid heart rate.  He went to [facility] yesterday where he had some blood work on but was in the waiting room for 5 hours and that a bleeding.  He is here today with wondering if he still needs to be on a seizure medication.  He is worried that it is causing him problems.  His last seizure was 2 years ago.  He is also concerned because he has had urinary symptoms for the last 8 years and is seen urology for this.  He wants to know why nobody can tell him what is going on.  States he saw his PCP about a month ago and was told everything looked okay.  The history is provided by the patient.  Anxiety  This is a new problem. The current episode started yesterday. The problem has been resolved. Pertinent negatives include no chest pain, no abdominal pain, no headaches and no shortness of breath. Nothing aggravates the symptoms. The symptoms are relieved by rest. He has tried rest for the symptoms. The treatment provided moderate relief.    Past Medical History:  Diagnosis Date  . Seizures [facility])     Patient Active Problem List   Diagnosis Date Noted  . SEIZURE DISORDER 12/15/2007  . PALPITATIONS 12/15/2007  . CHICKENPOX, HX OF 12/15/2007    History reviewed. No pertinent surgical history.      Home Medications    Prior to Admission medications   Medication Sig Start Date End Date Taking? Authorizing Provider  carbamazepine (TEGRETOL) 200 MG tablet Take 200 mg by mouth 3 (three) times daily.    [provider]  ciprofloxacin (CIPRO) 500 MG tablet Take 1 tablet (500 mg total) by mouth 2 (two) times daily. 10/01/17   Terrilee Files, MD    Family History No family history on file.  Social History Social History   Tobacco Use  . Smoking status: Never Smoker  . Smokeless tobacco: Never Used  Substance Use Topics  . Alcohol use: Yes    Comment: occ  . Drug use: No     Allergies   Penicillin g   Review of Systems Review of Systems  Constitutional: Negative for chills and fever.  HENT: Negative for sore throat.   Eyes: Negative for visual disturbance.  Respiratory: Negative for shortness of breath.   Cardiovascular: Positive for palpitations. Negative for chest pain.  Gastrointestinal: Negative for abdominal pain.  Genitourinary: Negative for hematuria.  Musculoskeletal: Negative for back pain.  Skin: Negative for rash.  Neurological: Negative for headaches.  Psychiatric/Behavioral: The patient is nervous/anxious.      Physical Exam Updated Vital Signs BP 121/85   Pulse 72   Temp 98.3 F (36.8 C) (Oral)   Resp 17   Ht  (1.778 m)   Wt 79.4 kg (175 lb)   SpO2 97%   BMI 25.11 kg/m   Physical Exam  Constitutional: He is oriented to person, place, and time. He appears well-developed and well-nourished.  HENT:  Head: Normocephalic and atraumatic.  Right Ear: External ear normal.  Left Ear: External ear normal.  Nose: Nose normal.  Mouth/Throat: Oropharynx is clear and moist. No oropharyngeal exudate.  Eyes: Pupils are equal, round, and reactive to light. Conjunctivae and EOM are normal. Right eye exhibits no discharge. Left eye exhibits no discharge.  Neck: Neck supple. No tracheal deviation present.  Cardiovascular: Normal rate and regular rhythm.  No murmur heard. Pulmonary/Chest: Effort normal and breath sounds normal. No respiratory distress.  Abdominal: Soft. There is no tenderness.  Musculoskeletal: He exhibits no edema, tenderness or deformity.  Neurological: He is alert and oriented to person, place, and time. No cranial nerve deficit or sensory deficit. He exhibits  normal muscle tone.  Skin: Skin is warm and dry.  Psychiatric: He has a normal mood and affect.  Nursing note and vitals reviewed.    ED Treatments / Results  Labs (all labs ordered are listed, but only abnormal results are displayed) Labs Reviewed  CARBAMAZEPINE LEVEL, TOTAL  VITAMIN D 25 HYDROXY (VIT D DEFICIENCY, FRACTURES)  I-STAT TROPONIN, ED    EKG EKG Interpretation  Date/Time:  Sunday Nov 13 2017 11:06:19 EDT Ventricular Rate:  80 PR Interval:    QRS Duration: 76 QT Interval:  368 QTC Calculation: 425 R Axis:   70 Text Interpretation:  Sinus rhythm nl intervals no acute st/ts no prior to compare with Confirmed by Meridee Score [phone]) on 11/13/2017 11:08:55 AM Also confirmed by Meridee Score [phone]), editor Evangeline Dakin [phone])  on 11/13/2017 12:32:39 PM   Radiology No results found.  Procedures Procedures (including critical care time)  Medications Ordered in ED Medications - No data to display   Initial Impression / Assessment and Plan / ED Course  I have reviewed the triage vital signs and the nursing notes.  Pertinent labs & imaging results that were available during my care of the patient were reviewed by me and considered in my medical decision making (see chart for details).    Patient had a CBC and chemistry yesterday while [place].  I was able to review these in care everywhere and they were unremarkable.  I added on a few tests here and there were no significant findings.  The patient asked if we could check a vitamin D because someone had told him a low vitamin D could account for his symptoms.  I informed the patient that we could draw that but he would need to follow-up with his primary care doctor for results of that test would not come back quickly.  Ultimately he has a very benign exam despite having a lot of subjective complaints.  I think there is a component of anxiety to this but I still think he would benefit from further work-up by his  PCP along with management of his anxiety.  He is instructed to return if any worsening of his symptoms.  Final Clinical Impressions(s) / ED Diagnoses   Final diagnoses:  Palpitations    ED Discharge Orders    None       Terrilee Files, MD 11/14/17 [phone]

## 2017-11-14 LAB — VITAMIN D 25 HYDROXY (VIT D DEFICIENCY, FRACTURES): VIT D 25 HYDROXY: 14.5 ng/mL — AB (ref 30.0–100.0)

## 2017-12-12 ENCOUNTER — Emergency Department (HOSPITAL_COMMUNITY)
Admission: EM | Admit: 2017-12-12 | Discharge: 2017-12-12 | Disposition: A | Discharge status: Home / Self Care | Payer: Self-pay | Attending: Emergency Medicine | Admitting: Emergency Medicine

## 2017-12-12 ENCOUNTER — Encounter (HOSPITAL_COMMUNITY): Payer: Self-pay | Admitting: Emergency Medicine

## 2017-12-12 ENCOUNTER — Other Ambulatory Visit: Payer: Self-pay

## 2017-12-12 DIAGNOSIS — M542 Cervicalgia: Secondary | ICD-10-CM

## 2017-12-12 DIAGNOSIS — M62838 Other muscle spasm: Secondary | ICD-10-CM | POA: Insufficient documentation

## 2017-12-12 DIAGNOSIS — Z79899 Other long term (current) drug therapy: Secondary | ICD-10-CM | POA: Insufficient documentation

## 2017-12-12 LAB — BASIC METABOLIC PANEL
ANION GAP: 9 (ref 5–15)
BUN: 9 mg/dL (ref 6–20)
CHLORIDE: 104 mmol/L (ref 101–111)
CO2: 28 mmol/L (ref 22–32)
CREATININE: 1.17 mg/dL (ref 0.61–1.24)
Calcium: 9.5 mg/dL (ref 8.9–10.3)
GFR calc Af Amer: >60 mL/min (ref >60)
GFR calc non Af Amer: >60 mL/min (ref >60)
GLUCOSE: 103 mg/dL — AB (ref 65–99)
Potassium: 4.2 mmol/L (ref 3.5–5.1)
Sodium: 141 mmol/L (ref 135–145)

## 2017-12-12 LAB — CBC WITH DIFFERENTIAL/PLATELET
Abs Immature Granulocytes: 0 10*3/uL (ref 0.0–0.1)
Basophils Absolute: 0.1 10*3/uL (ref 0.0–0.1)
Basophils Relative: 2 %
EOS ABS: 0.2 10*3/uL (ref 0.0–0.7)
EOS PCT: 4 %
HEMATOCRIT: 49 % (ref 39.0–52.0)
HEMOGLOBIN: 16.4 g/dL (ref 13.0–17.0)
Immature Granulocytes: 0 %
LYMPHS PCT: 29 %
Lymphs Abs: 1.7 10*3/uL (ref 0.7–4.0)
MCH: 31.2 pg (ref 26.0–34.0)
MCHC: 33.5 g/dL (ref 30.0–36.0)
MCV: 93.2 fL (ref 78.0–100.0)
MONO ABS: 0.7 10*3/uL (ref 0.1–1.0)
MONOS PCT: 12 %
Neutro Abs: 3.2 10*3/uL (ref 1.7–7.7)
Neutrophils Relative %: 53 %
Platelets: 239 10*3/uL (ref 150–400)
RBC: 5.26 MIL/uL (ref 4.22–5.81)
RDW: 11.8 % (ref 11.5–15.5)
WBC: 5.8 10*3/uL (ref 4.0–10.5)

## 2017-12-12 LAB — URINALYSIS, ROUTINE W REFLEX MICROSCOPIC
Bilirubin Urine: NEGATIVE
GLUCOSE, UA: NEGATIVE mg/dL
Hgb urine dipstick: NEGATIVE
Ketones, ur: NEGATIVE mg/dL
LEUKOCYTES UA: NEGATIVE
Nitrite: NEGATIVE
PH: 7 (ref 5.0–8.0)
Protein, ur: NEGATIVE mg/dL
SPECIFIC GRAVITY, URINE: 1.009 (ref 1.005–1.030)

## 2017-12-12 LAB — CARBAMAZEPINE LEVEL, TOTAL: Carbamazepine Lvl: 3.8 ug/mL — ABNORMAL LOW (ref 4.0–12.0)

## 2017-12-12 MED ORDER — METHOCARBAMOL 500 MG PO TABS: 500.0000 mg | ORAL_TABLET | Freq: Two times a day (BID) | ORAL | 0 refills | Status: DC

## 2017-12-12 MED ORDER — METHOCARBAMOL 500 MG PO TABS
500.0000 mg | ORAL_TABLET | Freq: Once | ORAL | Status: AC
  Administered 2017-12-12: 500 mg via ORAL
  Filled 2017-12-12: qty 1

## 2017-12-12 NOTE — ED Provider Notes (Signed)
[name] EMERGENCY DEPARTMENT Provider Note   CSN: [id number] Arrival date & time: 12/12/17  0831     History   Chief Complaint Chief Complaint  Patient presents with  . Neck Pain    HPI Eduardo Moreno is a 42 y.o. male with past medical history of seizures who presents for evaluation of right-sided neck pain thatbegan approximately 1 week ago.  Patient also reports that he came because when he woke up, he felt "jittery almost like I would have a seizure.".  Patient reports that he does have a history of seizures and is currently on Tegretol.  He states he has been taking his medication and has not missed a dose.  Patient reports he is normally able to feel when he is going to have a seizure.  Patient reports he will start twitching.  Patient reports he has not had any seizure activity since onset of symptoms.  Patient reports that neck pain began after playing basketball 1 week ago.  He denies any fall, trauma, injury.  He has been able to ambulate without any difficulty.  He reports that the pain is on the right side and radiates down to the right trapezius.  Worse with movement.  He is not taking any medications for the symptoms.  Patient states that he has been very worried because he was diagnosed with prostatitis.  Patient reports that his recent urology visits had been reassuring and there is no other concerning signs of his prostate but patient is concerned that his "issues with his prostate "are spreading up towards his neck." He has had some intermittent dysuria.  Patient denies any fevers, night sweats, unintentional weight loss, vision changes, numbness/weakness of his extremities, chest pain, difficulty breathing, abdominal pain, nausea/vomiting, testicular pain or swelling, hematuria. Denies fevers, weight loss, numbness/weakness of upper and lower extremities, bowel/bladder incontinence, saddle anesthesia, history of back surgery, history of IVDA.    The  history is provided by the patient.    Past Medical History:  Diagnosis Date  . Seizures [facility])     Patient Active Problem List   Diagnosis Date Noted  . SEIZURE DISORDER 12/15/2007  . PALPITATIONS 12/15/2007  . CHICKENPOX, HX OF 12/15/2007    History reviewed. No pertinent surgical history.      Home Medications    Prior to Admission medications   Medication Sig Start Date End Date Taking? Authorizing Provider  carbamazepine (TEGRETOL) 200 MG tablet Take 200 mg by mouth 3 (three) times daily.   Yes [provider]  ciprofloxacin (CIPRO) 500 MG tablet Take 1 tablet (500 mg total) by mouth 2 (two) times daily. 10/01/17  Yes Terrilee Files, MD  Multiple Vitamin (MULTIVITAMIN) tablet Take 1 tablet by mouth daily.   Yes [provider]  methocarbamol (ROBAXIN) 500 MG tablet Take 1 tablet (500 mg total) by mouth 2 (two) times daily. 12/12/17   Maxwell Caul, PA-C    Family History History reviewed. No pertinent family history.  Social History Social History   Tobacco Use  . Smoking status: Never Smoker  . Smokeless tobacco: Never Used  Substance Use Topics  . Alcohol use: Never    Frequency: Never    Comment: quit FEb 2019  . Drug use: No     Allergies   Shrimp [shellfish allergy] and Penicillin g   Review of Systems Review of Systems  Constitutional: Negative for fever and unexpected weight change.  Eyes: Negative for visual disturbance.  Respiratory: Negative  for cough and shortness of breath.   Cardiovascular: Negative for chest pain.  Gastrointestinal: Negative for abdominal pain, nausea and vomiting.  Genitourinary: Positive for dysuria. Negative for hematuria, penile swelling, scrotal swelling and testicular pain.  Musculoskeletal: Positive for neck pain (right sided).  Neurological: Negative for weakness, numbness and headaches.  All other systems reviewed and are negative.    Physical Exam Updated Vital Signs BP (!) 137/94    Pulse 67   Temp 98.5 F (36.9 C) (Oral)   Resp 18   Ht 5\' 10"  (1.778 m)   Wt 78.5 kg (173 lb)   SpO2 100%   BMI 24.82 kg/m   Physical Exam  Constitutional: He is oriented to person, place, and time. He appears well-developed and well-nourished.  HENT:  Head: Normocephalic and atraumatic.  Mouth/Throat: Oropharynx is clear and moist and mucous membranes are normal.  Eyes: Pupils are equal, round, and reactive to light. Conjunctivae, EOM and lids are normal.  Neck: Full passive range of motion without pain. Neck supple. Carotid bruit is not present. No neck rigidity.    Full flexion/extension and lateral movement of neck fully intact.  Diffuse tenderness overlying the paraspinal muscles of the right cervical region that extends over to the right trapezius.  No bony midline tenderness. No deformities or crepitus.  No neck rigidity.  No carotid bruit.   Cardiovascular: Normal rate, regular rhythm, normal heart sounds and normal pulses. Exam reveals no gallop and no friction rub.  No murmur heard. Pulmonary/Chest: Effort normal and breath sounds normal.  Abdominal: Soft. Normal appearance. There is no tenderness. There is no rigidity and no guarding. Hernia confirmed negative in the right inguinal area and confirmed negative in the left inguinal area.  Genitourinary: Prostate normal, testes normal and penis normal. Prostate is not enlarged and not tender. Right testis shows no swelling and no tenderness. Left testis shows no swelling and no tenderness.  Genitourinary Comments: The exam was performed with a chaperone present. Normal male genitalia. No evidence of rash, ulcers or lesions.  No tenderness noted on prostate exam.  No enlargement.  No tenderness noted to the perineal area.  Musculoskeletal: Normal range of motion.  Neurological: He is alert and oriented to person, place, and time.  Cranial nerves III-XII intact Follows commands, Moves all extremities  5/5 strength to BUE and BLE    Sensation intact throughout all major nerve distributions Normal finger to nose. No dysdiadochokinesia. No pronator drift. No gait abnormalities  No slurred speech. No facial droop.   Skin: Skin is warm and dry. Capillary refill takes less than 2 seconds.  Psychiatric: He has a normal mood and affect. His speech is normal.  Nursing note and vitals reviewed.    ED Treatments / Results  Labs (all labs ordered are listed, but only abnormal results are displayed) Labs Reviewed  CARBAMAZEPINE LEVEL, TOTAL - Abnormal; Notable for the following components:      Result Value   Carbamazepine Lvl 3.8 (*)    All other components within normal limits  BASIC METABOLIC PANEL - Abnormal; Notable for the following components:   Glucose, Bld 103 (*)    All other components within normal limits  URINALYSIS, ROUTINE W REFLEX MICROSCOPIC - Abnormal; Notable for the following components:   Color, Urine STRAW (*)    All other components within normal limits  URINE CULTURE  CBC WITH DIFFERENTIAL/PLATELET    EKG None  Radiology No results found.  Procedures Procedures (including critical care time)  Medications Ordered in ED Medications  methocarbamol (ROBAXIN) tablet 500 mg (500 mg Oral Given 12/12/17 1044)     Initial Impression / Assessment and Plan / ED Course  I have reviewed the triage vital signs and the nursing notes.  Pertinent labs & imaging results that were available during my care of the patient were reviewed by me and considered in my medical decision making (see chart for details).      42 year old male with past medical history of seizures who presents for evaluation of right-sided neck pain.  Patient is also worried about recently been diagnosed with prostatitis and states that he is concerned that it is gone up to his neck.  He reports that he felt jittery this morning like he almost may have a seizure.  Patient has not had any seizure activity.  He has been taking his  medications as directed.  He is scheduled to see neurology tomorrow.  No fevers, vision changes, difficulty walking, numbness/weakness of extremities. Patient is afebrile, non-toxic appearing, sitting comfortably on examination table. Vital signs reviewed and stable. No neuro deficits noted on exam.  No red flags noted history.  Exam shows tenderness palpation noted to the right-sided paraspinal muscles.  No midline tenderness.  Full range of motion without any difficulty.  No meningismal signs.  Consider muscle strain.  History/physical exam is not concerning for meningitis, CVA, carotid artery dissection.  Plan for muscle relaxers here in the department.  Initial labs ordered at triage.  BMP unremarkable.  UA negative for any acute infectious etiology.  CBC negative for any acute leukocytosis or anemia.  Tegretol level is 3.8.  Patient reports he has been taking his medications.  He has not had any seizure activity at home and has not had any seizure activity while being here in the ED.  He is scheduled to see neurology tomorrow.  Will defer any changes in medications neurology appointment.  Reevaluation.  Patient is ambulate in the department any difficulty.  Repeat neck exam shows still tenderness noted to the right-sided paraspinal.  No midline tenderness.  Patient stable for discharge at this time. Patient had ample opportunity for questions and discussion. All patient's questions were answered with full understanding. Strict return precautions discussed. Patient expresses understanding and agreement to plan.   Final Clinical Impressions(s) / ED Diagnoses   Final diagnoses:  Neck pain  Muscle spasms of neck    ED Discharge Orders        Ordered    methocarbamol (ROBAXIN) 500 MG tablet  2 times daily,   Status:  Discontinued     12/12/17 1328    methocarbamol (ROBAXIN) 500 MG tablet  2 times daily     12/12/17 1331       Maxwell CaulLayden, Jeanae Whitmill A, PA-C 12/12/17 1402    Arby BarrettePfeiffer, Marcy,  MD 12/16/17 1512

## 2017-12-12 NOTE — Discharge Instructions (Addendum)
Follow-up with your neurologist as directed.   Follow-up with your urologist as directed.   You can take Tylenol or Ibuprofen as directed for pain. You can alternate Tylenol and Ibuprofen every 4 hours. If you take Tylenol at 1pm, then you can take Ibuprofen at 5pm. Then you can take Tylenol again at 9pm.    Take Robaxin as prescribed. This medication will make you drowsy so do not drive or drink alcohol when taking it.  Return to the emergency department for any worsening neck pain, difficulty walking, numbness/weakness of your arms or legs, fever, vision changes, chest pain, difficulty breathing, nausea/vomiting or any other worsening or concerning symptoms.

## 2017-12-12 NOTE — ED Provider Notes (Signed)
Medical screening examination/treatment/procedure(s) were conducted as a shared visit with non-physician practitioner(s) and myself.  I personally evaluated the patient during the encounter.  None Patient has pain on the right side of his neck that radiates down to the level of the trapezius.  He did play basketball last week and noticed the pain afterwards.  He has no associated symptoms.  It is worse with movement.  Patient's greatest concern is it because he has had recurrent issues with his prostate over the past number of years, that this somehow could represent a type of cancer.   Patient is alert and nontoxic.  He is clinically well in appearance.  The pain is easily reproducible in the paraspinous muscle bodies and trapezius.  There is no bruit or concerning falling or mass in the soft tissues of the neck.  Patient has a normal cardiovascular exam.  At this time, there is no reason based on the history or physical exam to suspect malignancy, vascular etiology or infectious etiology.  Patient is counseled to discuss his concerns with his neurologist and urologist so they can do appropriate surveillance as indicated.  At this time however symptoms and physical exam are consistent with mild musculoskeletal strain.   Arby BarrettePfeiffer, Neidy Guerrieri, MD 12/16/17 [id number]

## 2017-12-12 NOTE — ED Triage Notes (Signed)
Pt arrives via POV from home with neck pain, denies recent fever. Pt states woke up this morning "fuzzy headed", thinking he might have a seizure and twitching. Pt awake, alert, oriented x4, VSS. States hasn't missed any doses of seizure medications.

## 2017-12-13 ENCOUNTER — Encounter: Payer: Self-pay | Admitting: *Deleted

## 2017-12-13 ENCOUNTER — Ambulatory Visit: Payer: Self-pay | Admitting: Neurology

## 2017-12-13 ENCOUNTER — Encounter: Payer: Self-pay | Admitting: Neurology

## 2017-12-13 DIAGNOSIS — G40309 Generalized idiopathic epilepsy and epileptic syndromes, not intractable, without status epilepticus: Secondary | ICD-10-CM

## 2017-12-13 LAB — URINE CULTURE: CULTURE: NO GROWTH

## 2017-12-13 NOTE — Progress Notes (Signed)
PATIENT: Eduardo Moreno DOB: Sep 29, 1975  Chief Complaint  Patient presents with  . Seizures    He is here with his girlfriend, Eduardo Moreno.  He had his first seizure at age 42.  His last event was 1.5 - 2 years ago.  He is currently Tegretol 200mg  TID.  Recently, he has noticed increased anxiety, left eye twitching/pressure, and tingling in his scalp.  He is here to get re-established with neurology.  He was seen in ED on 12/12/17 for neck pain.    Marland Kitchen PCP    Tarri Fuller, MD (referred from hospital)     HISTORICAL  Eduardo Moreno, is a 42 years old male, is accompanied by her girlfriend Eduardo Moreno seen in refer by his primary care physician Dr. Tarri Fuller for evaluation of seizure, initial evaluation was on December 13, 2017.  He reported a history of seizures since 42 years old, presented with sudden body jerking, sometimes multiple episode few minutes even to hours prior to his generalized tonic-clonic seizure, often triggered by alcohol, and sleep deprivation,  He has been treated with Tegretol 200 mg tablets for many years, sometimes 2 tablets a day, 3 times a day, he would have recurrent seizure if he missed medications, last generalized seizure was in 2017, he has been compliant with his medications, sometimes he has body jerking movement without seizures, he has average seizure-like activity about once or twice each year.  Since beginning of 2019, he also noticed increased anxiety, worry about his body symptoms, today's office visit, he focuses symptoms on his prostate, complains of pressure painful sensation at the lower abdomen, pelvic, groin, even anus region, which has caused a lot of anxiety for him, has urology follow-up scheduled,  He presented to the emergency room multiple times since 2019, for various reasons, neck pain, anxiety, rapid heart rate, on the edge, feeling scared shaky,  MRI of the brain in March 2019 from outside hospital was normal  Laboratory  evaluations in June 2019 showed normal CBC, BMP, negative troponin, carbamazepine level was 3.8  REVIEW OF SYSTEMS: Full 14 system review of systems performed and notable only for sleepiness, seizure, tremor, anxiety, not enough sleep, racing thoughts, increased thirst, joint pain, frequent infections, palpitation  ALLERGIES: Allergies  Allergen Reactions  . Shrimp [Shellfish Allergy] Nausea And Vomiting  . Penicillin G Rash    Unspecified Has patient had a PCN reaction causing immediate rash, facial/tongue/throat swelling, SOB or lightheadedness with hypotension: unk Has patient had a PCN reaction causing severe rash involving mucus membranes or skin necrosis: unk Has patient had a PCN reaction that required hospitalization: unk Has patient had a PCN reaction occurring within the last 10 years: unk If all of the above answers are "NO", then may proceed with Cephalosporin use.     HOME MEDICATIONS: Current Outpatient Medications  Medication Sig Dispense Refill  . carbamazepine (TEGRETOL) 200 MG tablet Take 200 mg by mouth 3 (three) times daily.    . methocarbamol (ROBAXIN) 500 MG tablet Take 1 tablet (500 mg total) by mouth 2 (two) times daily. 10 tablet 0  . Multiple Vitamin (MULTIVITAMIN) tablet Take 1 tablet by mouth daily.     No current facility-administered medications for this visit.     PAST MEDICAL HISTORY: Past Medical History:  Diagnosis Date  . Seizures (HCC)     PAST SURGICAL HISTORY: History reviewed. No pertinent surgical history.  FAMILY HISTORY: Family History  Problem Relation Age of Onset  . Heart  attack Mother        age 71  . Congestive Heart Failure Father     SOCIAL HISTORY:  Social History   Socioeconomic History  . Marital status: Single    Spouse name: Not on file  . Number of children: 5  . Years of education: 82  . Highest education level: High school graduate  Occupational History  . Occupation: self-employed Customer service manager, Product manager  business)  Social Needs  . Financial resource strain: Not on file  . Food insecurity:    Worry: Not on file    Inability: Not on file  . Transportation needs:    Medical: Not on file    Non-medical: Not on file  Tobacco Use  . Smoking status: Never Smoker  . Smokeless tobacco: Never Used  Substance and Sexual Activity  . Alcohol use: Never    Frequency: Never    Comment: quit Feb 2019  . Drug use: No  . Sexual activity: Yes    Birth control/protection: None  Lifestyle  . Physical activity:    Days per week: Not on file    Minutes per session: Not on file  . Stress: Not on file  Relationships  . Social connections:    Talks on phone: Not on file    Gets together: Not on file    Attends religious service: Not on file    Active member of club or organization: Not on file    Attends meetings of clubs or organizations: Not on file    Relationship status: Not on file  . Intimate partner violence:    Fear of current or ex partner: Not on file    Emotionally abused: Not on file    Physically abused: Not on file    Forced sexual activity: Not on file  Other Topics Concern  . Not on file  Social History Narrative   Lives at home with his family.   Right-handed.   No caffeine use.     PHYSICAL EXAM   Vitals:   12/13/17 1324  BP: 138/87  Pulse: 77  Weight: 167 lb 8 oz (76 kg)  Height: 5\' 10"  (1.778 m)    Not recorded      Body mass index is 24.03 kg/m.  PHYSICAL EXAMNIATION:  Gen: NAD, conversant, well nourised, obese, well groomed                     Cardiovascular: Regular rate rhythm, no peripheral edema, warm, nontender. Eyes: Conjunctivae clear without exudates or hemorrhage Neck: Supple, no carotid bruits. Pulmonary: Clear to auscultation bilaterally   NEUROLOGICAL EXAM:  MENTAL STATUS: Speech:    Speech is normal; fluent and spontaneous with normal comprehension.  Cognition:     Orientation to time, place and person     Normal recent and remote  memory     Normal Attention span and concentration     Normal Language, naming, repeating,spontaneous speech     Fund of knowledge   CRANIAL NERVES: CN II: Visual fields are full to confrontation. Fundoscopic exam is normal with sharp discs and no vascular changes. Pupils are round equal and briskly reactive to light. CN III, IV, VI: extraocular movement are normal. No ptosis. CN V: Facial sensation is intact to pinprick in all 3 divisions bilaterally. Corneal responses are intact.  CN VII: Face is symmetric with normal eye closure and smile. CN VIII: Hearing is normal to rubbing fingers CN IX, X: Palate elevates symmetrically. Phonation  is normal. CN XI: Head turning and shoulder shrug are intact CN XII: Tongue is midline with normal movements and no atrophy.  MOTOR: There is no pronator drift of out-stretched arms. Muscle bulk and tone are normal. Muscle strength is normal.  REFLEXES: Reflexes are 2+ and symmetric at the biceps, triceps, knees, and ankles. Plantar responses are flexor.  SENSORY: Intact to light touch, pinprick, positional sensation and vibratory sensation are intact in fingers and toes.  COORDINATION: Rapid alternating movements and fine finger movements are intact. There is no dysmetria on finger-to-nose and heel-knee-shin.    GAIT/STANCE: Posture is normal. Gait is steady with normal steps, base, arm swing, and turning. Heel and toe walking are normal. Tandem gait is normal.  Romberg is absent.   DIAGNOSTIC DATA (LABS, IMAGING, TESTING) - I reviewed patient records, labs, notes, testing and imaging myself where available.   ASSESSMENT AND PLAN  Eduardo Moreno is a 42 y.o. male   Epilepsy  History suggestive of generalized epilepsy,   Repeat EEG  I have discussed with patient higher dose of carbamazepine versus switch to lamotrigine, he does not want to change it at this time,   Anxiety  Continue follow-up with his primary care physician  Levert FeinsteinYijun  Eliya Bubar, M.D. Ph.D.  [facility] [address], [address] [place], [place] [address] Ph: [phone] Fax: [phone]  CC: Tarri FullerEscajeda, Richard, MD

## 2017-12-20 ENCOUNTER — Other Ambulatory Visit: Payer: Self-pay

## 2018-07-03 ENCOUNTER — Telehealth: Payer: Self-pay | Admitting: *Deleted

## 2018-07-03 ENCOUNTER — Other Ambulatory Visit: Payer: Self-pay | Admitting: *Deleted

## 2018-07-03 ENCOUNTER — Ambulatory Visit (INDEPENDENT_AMBULATORY_CARE_PROVIDER_SITE_OTHER): Payer: Self-pay | Admitting: Neurology

## 2018-07-03 DIAGNOSIS — G40309 Generalized idiopathic epilepsy and epileptic syndromes, not intractable, without status epilepticus: Secondary | ICD-10-CM

## 2018-07-03 NOTE — Telephone Encounter (Addendum)
Pt arrived late to follow up appt.  He has still not had his EEG yet.  Per Dr. Terrace ArabiaYan, reschedule his follow up after his EEG is completed.

## 2018-07-03 NOTE — Telephone Encounter (Signed)
No showed follow up appointment. 

## 2018-08-08 ENCOUNTER — Other Ambulatory Visit: Payer: Self-pay

## 2018-08-22 ENCOUNTER — Encounter (HOSPITAL_COMMUNITY): Payer: Self-pay | Admitting: Emergency Medicine

## 2018-08-22 ENCOUNTER — Emergency Department (HOSPITAL_COMMUNITY)
Admission: EM | Admit: 2018-08-22 | Discharge: 2018-08-23 | Disposition: A | Discharge status: Home / Self Care | Payer: Self-pay | Attending: Emergency Medicine | Admitting: Emergency Medicine

## 2018-08-22 ENCOUNTER — Other Ambulatory Visit: Payer: Self-pay

## 2018-08-22 ENCOUNTER — Emergency Department (HOSPITAL_COMMUNITY): Payer: Self-pay

## 2018-08-22 DIAGNOSIS — R519 Headache, unspecified: Secondary | ICD-10-CM

## 2018-08-22 DIAGNOSIS — R51 Headache: Secondary | ICD-10-CM | POA: Insufficient documentation

## 2018-08-22 DIAGNOSIS — Z79899 Other long term (current) drug therapy: Secondary | ICD-10-CM | POA: Insufficient documentation

## 2018-08-22 LAB — DIFFERENTIAL
ABS IMMATURE GRANULOCYTES: 0.01 10*3/uL (ref 0.00–0.07)
BASOS PCT: 1 %
Basophils Absolute: 0.1 10*3/uL (ref 0.0–0.1)
EOS ABS: 0.2 10*3/uL (ref 0.0–0.5)
EOS PCT: 3 %
IMMATURE GRANULOCYTES: 0 %
LYMPHS ABS: 2.1 10*3/uL (ref 0.7–4.0)
Lymphocytes Relative: 35 %
Monocytes Absolute: 0.5 10*3/uL (ref 0.1–1.0)
Monocytes Relative: 9 %
NEUTROS PCT: 52 %
Neutro Abs: 3.1 10*3/uL (ref 1.7–7.7)

## 2018-08-22 LAB — COMPREHENSIVE METABOLIC PANEL
ALBUMIN: 4.2 g/dL (ref 3.5–5.0)
ALT: 28 U/L (ref 0–44)
ANION GAP: 13 (ref 5–15)
AST: 22 U/L (ref 15–41)
Alkaline Phosphatase: 54 U/L (ref 38–126)
BUN: 9 mg/dL (ref 6–20)
CHLORIDE: 106 mmol/L (ref 98–111)
CO2: 21 mmol/L — AB (ref 22–32)
Calcium: 8.9 mg/dL (ref 8.9–10.3)
Creatinine, Ser: 1 mg/dL (ref 0.61–1.24)
GFR calc Af Amer: >60 mL/min (ref >60)
GLUCOSE: 139 mg/dL — AB (ref 70–99)
POTASSIUM: 3.6 mmol/L (ref 3.5–5.1)
Sodium: 140 mmol/L (ref 135–145)
Total Bilirubin: 0.6 mg/dL (ref 0.3–1.2)
Total Protein: 6.9 g/dL (ref 6.5–8.1)

## 2018-08-22 LAB — CBC
HCT: 48.4 % (ref 39.0–52.0)
Hemoglobin: 16.6 g/dL (ref 13.0–17.0)
MCH: 31.1 pg (ref 26.0–34.0)
MCHC: 34.3 g/dL (ref 30.0–36.0)
MCV: 90.8 fL (ref 80.0–100.0)
NRBC: 0 % (ref 0.0–0.2)
PLATELETS: 237 10*3/uL (ref 150–400)
RBC: 5.33 MIL/uL (ref 4.22–5.81)
RDW: 11.5 % (ref 11.5–15.5)
WBC: 6 10*3/uL (ref 4.0–10.5)

## 2018-08-22 LAB — APTT: APTT: 27 s (ref 24–36)

## 2018-08-22 LAB — PROTIME-INR
INR: 1.33
PROTHROMBIN TIME: 16.3 s — AB (ref 11.4–15.2)

## 2018-08-22 MED ORDER — SODIUM CHLORIDE 0.9% FLUSH
3.0000 mL | Freq: Once | INTRAVENOUS | Status: DC
Start: 2018-08-22 — End: 2018-08-23

## 2018-08-22 NOTE — ED Triage Notes (Signed)
Patient here with dizziness and migraine for the last two days.  He does not have any slurred speech or facial droop, equal hand grips and pedal pushes.  No arm drift.  He has pressure in his head.  He is CAOx4.  Patient states that he has been out of work for three days and he does not miss work.

## 2018-08-23 MED ORDER — METOCLOPRAMIDE HCL 5 MG/ML IJ SOLN
10.0000 mg | Freq: Once | INTRAMUSCULAR | Status: AC
  Administered 2018-08-23: 10 mg via INTRAVENOUS
  Filled 2018-08-23: qty 2

## 2018-08-23 MED ORDER — SODIUM CHLORIDE 0.9 % IV BOLUS
1000.0000 mL | Freq: Once | INTRAVENOUS | Status: AC
  Administered 2018-08-23: 1000 mL via INTRAVENOUS

## 2018-08-23 MED ORDER — DEXAMETHASONE SODIUM PHOSPHATE 10 MG/ML IJ SOLN
10.0000 mg | Freq: Once | INTRAMUSCULAR | Status: AC
  Administered 2018-08-23: 10 mg via INTRAVENOUS
  Filled 2018-08-23: qty 1

## 2018-08-23 MED ORDER — DIPHENHYDRAMINE HCL 50 MG/ML IJ SOLN
25.0000 mg | Freq: Once | INTRAMUSCULAR | Status: AC
  Administered 2018-08-23: 25 mg via INTRAVENOUS
  Filled 2018-08-23: qty 1

## 2018-08-23 NOTE — ED Notes (Signed)
Patient verbalizes understanding of discharge instructions. Opportunity for questioning and answers were provided. Armband removed by staff, pt discharged from ED. Ambulated out to lobby  

## 2018-08-23 NOTE — ED Provider Notes (Signed)
[facility] EMERGENCY DEPARTMENT Provider Note   CSN: [id number] Arrival date & time: 08/22/18  2216     History   Chief Complaint Chief Complaint  Patient presents with  . Migraine  . Dizziness    HPI Eduardo Moreno is a 43 y.o. male.  The history is provided by the patient.  He has history of seizure disorder and comes in complaining of a headache.  Headache started 4 days ago and was bifrontal.  There is associated blurring of vision and difficulty focusing.  Headache actually seemed to resolve after 2 days but there is now a pressure in the same region.  There was some nausea but no vomiting.  He initially had photophobia which has resolved.  He denies phonophobia.  Denies any weakness, numbness, tingling.  He did not take anything to help his symptoms.  Discomfort will occasionally get as severe as 10/10.  Past Medical History:  Diagnosis Date  . Seizures [facility])     Patient Active Problem List   Diagnosis Date Noted  . Generalized idiopathic epilepsy and epileptic syndromes, not intractable, without status epilepticus (HCC) 12/13/2017  . SEIZURE DISORDER 12/15/2007  . PALPITATIONS 12/15/2007  . CHICKENPOX, HX OF 12/15/2007    History reviewed. No pertinent surgical history.      Home Medications    Prior to Admission medications   Medication Sig Start Date End Date Taking? Authorizing Provider  carbamazepine (TEGRETOL) 200 MG tablet Take 200 mg by mouth 3 (three) times daily.    [provider]  methocarbamol (ROBAXIN) 500 MG tablet Take 1 tablet (500 mg total) by mouth 2 (two) times daily. 12/12/17   Maxwell Caul, PA-C  Multiple Vitamin (MULTIVITAMIN) tablet Take 1 tablet by mouth daily.    [provider]    Family History Family History  Problem Relation Age of Onset  . Heart attack Mother        age 40  . Congestive Heart Failure Father     Social History Social History   Tobacco Use  . Smoking status:  Never Smoker  . Smokeless tobacco: Never Used  Substance Use Topics  . Alcohol use: Never    Frequency: Never    Comment: quit Feb 2019  . Drug use: No     Allergies   Shrimp [shellfish allergy] and Penicillin g   Review of Systems Review of Systems  All other systems reviewed and are negative.    Physical Exam Updated Vital Signs BP (!) 141/80 (BP Location: Right Arm)   Pulse 62   Temp 98 F (36.7 C) (Oral)   Resp 16   Ht 5\' 10"  (1.778 m)   Wt 74.8 kg   SpO2 98%   BMI 23.68 kg/m   Physical Exam Vitals signs and nursing note reviewed.    43 year old male, resting comfortably and in no acute distress. Vital signs are significant for borderline elevated blood pressure. Oxygen saturation is 98%, which is normal. Head is normocephalic and atraumatic. PERRLA, EOMI. Oropharynx is clear.  There is tenderness to palpation over the temporalis muscles bilaterally and over the insertion of the paracervical muscles bilaterally. Neck is nontender and supple without adenopathy or JVD. Back is nontender and there is no CVA tenderness. Lungs are clear without rales, wheezes, or rhonchi. Chest is nontender. Heart has regular rate and rhythm without murmur. Abdomen is soft, flat, nontender without masses or hepatosplenomegaly and peristalsis is normoactive. Extremities have no cyanosis or edema, full  range of motion is present. Skin is warm and dry without rash. Neurologic: Mental status is normal, cranial nerves are intact, there are no motor or sensory deficits.  ED Treatments / Results  Labs (all labs ordered are listed, but only abnormal results are displayed) Labs Reviewed  PROTIME-INR - Abnormal; Notable for the following components:      Result Value   Prothrombin Time 16.3 (*)    All other components within normal limits  COMPREHENSIVE METABOLIC PANEL - Abnormal; Notable for the following components:   CO2 21 (*)    Glucose, Bld 139 (*)    All other components within  normal limits  APTT  CBC  DIFFERENTIAL  CBG MONITORING, ED    EKG EKG Interpretation  Date/Time:  Tuesday August 22 2018 22:51:43 EST Ventricular Rate:  58 PR Interval:  156 QRS Duration: 84 QT Interval:  412 QTC Calculation: 404 R Axis:   59 Text Interpretation:  Sinus bradycardia Otherwise normal ECG When compared with ECG of 11/13/2017, HEART RATE has decreased Confirmed by Dione Booze ([id number]) on 08/22/2018 11:44:43 PM   Radiology Ct Head Wo Contrast  Result Date: 08/22/2018 CLINICAL DATA:  Headache, dizziness EXAM: CT HEAD WITHOUT CONTRAST TECHNIQUE: Contiguous axial images were obtained from the base of the skull through the vertex without intravenous contrast. COMPARISON:  MRI 09/23/2017 FINDINGS: Brain: No acute intracranial abnormality. Specifically, no hemorrhage, hydrocephalus, mass lesion, acute infarction, or significant intracranial injury. Vascular: No hyperdense vessel or unexpected calcification. Skull: No acute calvarial abnormality. Sinuses/Orbits: Visualized paranasal sinuses and mastoids clear. Orbital soft tissues unremarkable. Other: None IMPRESSION: Normal head CT. Electronically Signed   By: Charlett Nose M.D.   On: 08/22/2018 23:15    Procedures Procedures   Medications Ordered in ED Medications  sodium chloride flush (NS) 0.9 % injection 3 mL (has no administration in time range)     Initial Impression / Assessment and Plan / ED Course  I have reviewed the triage vital signs and the nursing notes.  Pertinent labs & imaging results that were available during my care of the patient were reviewed by me and considered in my medical decision making (see chart for details).  Headache with history and physical findings suggestive of muscle contraction headache.  No red flags to suggest more serious causes of headache.  CT of head was obtained and was normal.  Screening labs do show mildly elevated glucose in prediabetic range.  Old records are reviewed, and  he has no relevant past visits.  He will be given IV fluids, metoclopramide, diphenhydramine, ketorolac and reassessed.  He felt better after above noted treatment. He is still concerned about his vision - difficulty focusing. He will be referred to ophthalmology.  Final Clinical Impressions(s) / ED Diagnoses   Final diagnoses:  Bad headache    ED Discharge Orders    None       Dione Booze, MD 08/23/18 [phone]

## 2018-09-07 ENCOUNTER — Ambulatory Visit: Payer: Self-pay | Admitting: Neurology

## 2018-09-07 ENCOUNTER — Encounter: Payer: Self-pay | Admitting: Neurology

## 2018-09-07 VITALS — BP 129/79 | HR 69 | Ht 70.0 in | Wt 168.5 lb

## 2018-09-07 DIAGNOSIS — R5383 Other fatigue: Secondary | ICD-10-CM

## 2018-09-07 DIAGNOSIS — G40309 Generalized idiopathic epilepsy and epileptic syndromes, not intractable, without status epilepticus: Secondary | ICD-10-CM

## 2018-09-07 NOTE — Progress Notes (Signed)
PATIENT: Eduardo Moreno Malta DOB: 1976/02/24  Chief Complaint  Patient presents with  . Seizures    He is here with his girlfriend, Teneka. He has continued taking Tegretol 200mg , one tablet TID.  No seizure activity.  He has noticed recent light sensitivity, especially when looking at his phone or watching television.  He says the bright lights cause a "strange sensation" in his head.  Denies pain, numbness or tingling.     HISTORICAL  Eduardo Moreno [place], is a 43 years old male, is accompanied by her girlfriend Prentice Dockereneka Pate seen in refer by his primary care physician Dr. Tarri FullerEscajeda, Richard for evaluation of seizure, initial evaluation was on December 13, 2017.  He reported a history of seizures since 43 years old, presented with sudden body jerking, sometimes multiple episode few minutes even to hours prior to his generalized tonic-clonic seizure, often triggered by alcohol, and sleep deprivation,  He has been treated with Tegretol 200 mg tablets for many years, 1 tablet 3 times a day, he would have recurrent seizure if he missed medications, last generalized seizure was in 2017, he has been compliant with his medications, sometimes he has body jerking movement without seizures, he has average seizure-like activity about once or twice each year.  Since beginning of 2019, he also noticed increased anxiety, worry about his body symptoms, today's office visit, he focuses symptoms on his prostate, complains of pressure painful sensation at the lower abdomen, pelvic, groin, even anus region, which has caused a lot of anxiety for him, has urology follow-up scheduled,  He presented to the emergency room multiple times since 2019, for various reasons, neck pain, anxiety, rapid heart rate, on the edge, feeling scared shaky,  MRI of the brain in March 2019 from outside hospital was normal  Laboratory evaluations in June 2019 showed normal CBC, BMP, negative troponin, carbamazepine level was 3.8  UPDATE  Sep 07 2018: Patient is accompanied by his wife at today's visit, he complains of anxiety, continue Tegretol 200 mg 3 times a day, there was no recurrent seizure, he complains that with bright light, stress, he felt funny sometimes, transient, sometimes wake up in the middle of the night difficulty breathing, difficulty going back to sleep, frequent choking spells during sleep, loud snoring,  I reviewed ED presentation August 22, 2018, he complains of blurry vision, headache, anxiety,  I personally reviewed CT head without contrast on August 22, 2018 and that was normal.  Laboratory evaluations showed normal CBC, CMP, with glucose of 139.  Previously ordered EEG, is scheduled in March  REVIEW OF SYSTEMS: Full 14 system review of systems performed and notable only for light sensitivity, double vision, loss of vision, shortness of breath, rectal pain, dizziness, headaches, anxiety, nervous  All rest review of the system were negative  ALLERGIES: Allergies  Allergen Reactions  . Shrimp [Shellfish Allergy] Nausea And Vomiting  . Penicillin G Rash    Unspecified Has patient had a PCN reaction causing immediate rash, facial/tongue/throat swelling, SOB or lightheadedness with hypotension: unk Has patient had a PCN reaction causing severe rash involving mucus membranes or skin necrosis: unk Has patient had a PCN reaction that required hospitalization: unk Has patient had a PCN reaction occurring within the last 10 years: unk If all of the above answers are "NO", then may proceed with Cephalosporin use.     HOME MEDICATIONS: Current Outpatient Medications  Medication Sig Dispense Refill  . carbamazepine (TEGRETOL) 200 MG tablet Take 200 mg by mouth 3 (  three) times daily.     No current facility-administered medications for this visit.     PAST MEDICAL HISTORY: Past Medical History:  Diagnosis Date  . Seizures (HCC)     PAST SURGICAL HISTORY: History reviewed. No pertinent  surgical history.  FAMILY HISTORY: Family History  Problem Relation Age of Onset  . Heart attack Mother        age 38  . Congestive Heart Failure Father     SOCIAL HISTORY:  Social History   Socioeconomic History  . Marital status: Single    Spouse name: Not on file  . Number of children: 5  . Years of education: 60  . Highest education level: High school graduate  Occupational History  . Occupation: self-employed Customer service manager, Product manager business)  Social Needs  . Financial resource strain: Not on file  . Food insecurity:    Worry: Not on file    Inability: Not on file  . Transportation needs:    Medical: Not on file    Non-medical: Not on file  Tobacco Use  . Smoking status: Never Smoker  . Smokeless tobacco: Never Used  Substance and Sexual Activity  . Alcohol use: Never    Frequency: Never    Comment: quit Feb 2019  . Drug use: No  . Sexual activity: Yes    Birth control/protection: None  Lifestyle  . Physical activity:    Days per week: Not on file    Minutes per session: Not on file  . Stress: Not on file  Relationships  . Social connections:    Talks on phone: Not on file    Gets together: Not on file    Attends religious service: Not on file    Active member of club or organization: Not on file    Attends meetings of clubs or organizations: Not on file    Relationship status: Not on file  . Intimate partner violence:    Fear of current or ex partner: Not on file    Emotionally abused: Not on file    Physically abused: Not on file    Forced sexual activity: Not on file  Other Topics Concern  . Not on file  Social History Narrative   Lives at home with his family.   Right-handed.   No caffeine use.     PHYSICAL EXAM   Vitals:   09/07/18 1520  BP: 129/79  Pulse: 69  Weight: 168 lb 8 oz (76.4 kg)  Height: 5\' 10"  (1.778 m)    Not recorded      Body mass index is 24.18 kg/m.  PHYSICAL EXAMNIATION:  Gen: NAD, conversant, well  nourised, obese, well groomed                     Cardiovascular: Regular rate rhythm, no peripheral edema, warm, nontender. Eyes: Conjunctivae clear without exudates or hemorrhage Neck: Supple, no carotid bruits. Pulmonary: Clear to auscultation bilaterally   NEUROLOGICAL EXAM:  MENTAL STATUS: Speech:    Speech is normal; fluent and spontaneous with normal comprehension.  Cognition:     Orientation to time, place and person     Normal recent and remote memory     Normal Attention span and concentration     Normal Language, naming, repeating,spontaneous speech     Fund of knowledge   CRANIAL NERVES: CN II: Visual fields are full to confrontation.  Pupils are round equal and briskly reactive to light. CN III, IV, VI: extraocular movement  are normal. No ptosis. CN V: Facial sensation is intact to pinprick in all 3 divisions bilaterally. Corneal responses are intact.  CN VII: Face is symmetric with normal eye closure and smile. CN VIII: Hearing is normal to rubbing fingers CN IX, X: Palate elevates symmetrically. Phonation is normal. CN XI: Head turning and shoulder shrug are intact CN XII: Tongue is midline with normal movements and no atrophy.  MOTOR: There is no pronator drift of out-stretched arms. Muscle bulk and tone are normal. Muscle strength is normal.  REFLEXES: Reflexes are 2+ and symmetric at the biceps, triceps, knees, and ankles. Plantar responses are flexor.  SENSORY: Intact to light touch, pinprick, positional sensation and vibratory sensation are intact in fingers and toes.  COORDINATION: Rapid alternating movements and fine finger movements are intact. There is no dysmetria on finger-to-nose and heel-knee-shin.    GAIT/STANCE: Posture is normal. Gait is steady with normal steps, base, arm swing, and turning. Heel and toe walking are normal. Tandem gait is normal.  Romberg is absent.   DIAGNOSTIC DATA (LABS, IMAGING, TESTING) - I reviewed patient records,  labs, notes, testing and imaging myself where available.   ASSESSMENT AND PLAN  TERAH VIENS is a 43 y.o. male   Epilepsy  History suggestive of generalized epilepsy,   Repeat EEG  After discussed with patient, he wants to stay on current Tegretol 200 mg 3 times a day   Excessive daytime fatigue, sleepiness,  Patient insist on to be evaluated for sleep study  Also emphasized with patient the importance to continue follow-up with his primary care and psychiatrist for his treatment of anxiety   Levert Feinstein, M.D. Ph.D.  [facility] [address], [address] [place], [place] [address] Ph: [phone] Fax: [phone]  CC: Tarri Fuller, MD

## 2018-09-25 ENCOUNTER — Other Ambulatory Visit: Payer: Self-pay

## 2018-10-23 ENCOUNTER — Telehealth: Payer: Self-pay | Admitting: Neurology

## 2018-10-23 NOTE — Telephone Encounter (Signed)
Due to current COVID 19 pandemic, our office is severely reducing in office visits until further notice, in order to minimize the risk to our patients and healthcare providers.   Called patient to offer a virtual visit for his 4/19 appointment, to which he gave consent and verbalized understanding of the steps involved in the Webex connection process. Patient understands that he will receive a call from [facility] to update chart history. I advised patient that he will receive an e-mail that contains the link for connection to the meeting. Patient agreed.  Pt understands that although there may be some limitations with this type of visit, we will take all precautions to reduce any security or privacy concerns.  Pt understands that this will be treated like an in office visit and we will file with pt's insurance, and there may be a patient responsible charge related to this service.  Pt's email is [email or web address]. Pt understands that the cisco webex software must be downloaded and operational on the device pt plans to use for the visit.

## 2018-10-30 ENCOUNTER — Other Ambulatory Visit: Payer: Self-pay

## 2018-11-07 NOTE — Telephone Encounter (Signed)
I called pt. Pt's meds, allergies, and PMH were updated.  Pt reports that he has never had a sleep study but does endorse snoring.  Pt's weight is 165 lbs and he is 5'10.  Pt was instructed on how to measure his neck size prior to his appt.  Pt has not yet downloaded cisco webex prior to his appt. He will be using his cell phone. Converted him to [email or web address] appt, can use webex as a back up. Texted link to pt.  Epworth Sleepiness Scale 0= would never doze 1= slight chance of dozing 2= moderate chance of dozing 3= high chance of dozing  Sitting and reading: 2 Watching TV: 2 Sitting inactive in a public place (ex. Theater or meeting): 0 As a passenger in a car for an hour without a break: 3 Lying down to rest in the afternoon: 2 Sitting and talking to someone: 0 Sitting quietly after lunch (no alcohol): 0 In a car, while stopped in traffic: 0 Total: 9  FSS: 18  Webex meeting info as back up: [email or web address] [phone] [email or web address] Meeting number (access code): [phone] Meeting password: [email or web address]

## 2018-11-08 ENCOUNTER — Other Ambulatory Visit: Payer: Self-pay

## 2018-11-08 ENCOUNTER — Encounter: Payer: Self-pay | Admitting: Neurology

## 2018-11-08 ENCOUNTER — Ambulatory Visit (INDEPENDENT_AMBULATORY_CARE_PROVIDER_SITE_OTHER): Payer: Self-pay | Admitting: Neurology

## 2018-11-08 DIAGNOSIS — G40309 Generalized idiopathic epilepsy and epileptic syndromes, not intractable, without status epilepticus: Secondary | ICD-10-CM

## 2018-11-08 DIAGNOSIS — G4700 Insomnia, unspecified: Secondary | ICD-10-CM

## 2018-11-08 DIAGNOSIS — G479 Sleep disorder, unspecified: Secondary | ICD-10-CM

## 2018-11-08 NOTE — Progress Notes (Signed)
Eduardo Foley, MD, PhD [facility] [address] [address] [place], [place] [address]   Virtual Visit via Video Note on 11/08/2018:  I connected with Eduardo Moreno on 11/08/18 at  3:30 PM EDT by a video enabled telemedicine application and verified that I am speaking with the correct person using two identifiers.   I discussed the limitations of evaluation and management by telemedicine and the availability of in person appointments. The patient expressed understanding and agreed to proceed.  History of Present Illness:  Eduardo Moreno is a 43 year old right-handed gentleman with an underlying medical history of seizure disorder, with whom I am conducting a virtual, video based new patient visit via webex in lieu of a face-to-face visit for evaluation of his sleep disorder, in particular, evaluation for obstructive sleep apnea. The patient is unaccompanied today and joins via cell phone, in a car, parked in a parking lot (in front of GF's workplace, he states). He is referred by Dr. Levert Feinstein and I reviewed her note from 09/07/2018. He complains of some snoring and difficulty with sleep initiation and sleep maintenance.  His Epworth sleepiness score is 9 out of 24, fatigue score is 18 out of 63.  He reports that for the past year he has had difficulty with insomnia, difficulty falling asleep as well as staying asleep. He reports that he has not had any recent seizures but sleep deprivation has been a trigger for his seizures. He has tried melatonin once, 3 mg strength, 2 pills but did not notice much in the way of difference. He is currently not working due to the virus pandemic, he was working at the [employer]. His bedtime when he was working was typically between 8:30 and 9 and rise time around 5. Sometimes he is up as late as midnight. He denies night to night nocturia or recurrent morning headaches or family history of sleep apnea. He has not  had a tonsillectomy. He lives with his girlfriend and 4 of his 5 children. They have no pets in the house. He does have a TV in his bedroom and it tends to stay on all night. He denies any regular or daily caffeine intake, likes to drink some green tea. He quit drinking alcohol in January 2019, denies any problems with alcohol, just chose not to drink any longer. he denies any loud snoring or positives in his breathing, denies any gasping sensations.  His Past Medical History Is Significant For: Past Medical History:  Diagnosis Date   Seizures (HCC)     His Past Surgical History Is Significant For: No past surgical history on file.  His Family History Is Significant For: Family History  Problem Relation Age of Onset   Heart attack Mother        age 55   Congestive Heart Failure Father     His Social History Is Significant For: Social History   Socioeconomic History   Marital status: Single    Spouse name: Not on file   Number of children: 5   Years of education: 12   Highest education level: High school graduate  Occupational History   Occupation: self-employed Customer service manager, Product manager business)  Social Network engineer strain: Not on file   Food insecurity:    Worry: Not on file    Inability: Not on file   Transportation needs:    Medical: Not on file    Non-medical: Not on file  Tobacco Use  Smoking status: Never Smoker   Smokeless tobacco: Never Used  Substance and Sexual Activity   Alcohol use: Never    Frequency: Never    Comment: quit Feb 2019   Drug use: No   Sexual activity: Yes    Birth control/protection: None  Lifestyle   Physical activity:    Days per week: Not on file    Minutes per session: Not on file   Stress: Not on file  Relationships   Social connections:    Talks on phone: Not on file    Gets together: Not on file    Attends religious service: Not on file    Active member of club or organization: Not on file     Attends meetings of clubs or organizations: Not on file    Relationship status: Not on file  Other Topics Concern   Not on file  Social History Narrative   Lives at home with his family.   Right-handed.   No caffeine use.    His Allergies Are:  Allergies  Allergen Reactions   Shrimp [Shellfish Allergy] Nausea And Vomiting   Penicillin G Rash    Unspecified Has patient had a PCN reaction causing immediate rash, facial/tongue/throat swelling, SOB or lightheadedness with hypotension: unk Has patient had a PCN reaction causing severe rash involving mucus membranes or skin necrosis: unk Has patient had a PCN reaction that required hospitalization: unk Has patient had a PCN reaction occurring within the last 10 years: unk If all of the above answers are "NO", then may proceed with Cephalosporin use.   :   His Current Medications Are:  Outpatient Encounter Medications as of 11/08/2018  Medication Sig   carbamazepine (TEGRETOL) 200 MG tablet Take 200 mg by mouth 3 (three) times daily.   No facility-administered encounter medications on file as of 11/08/2018.   :  Review of Systems:  Out of a complete 14 point review of systems, all are reviewed and negative with the exception of these symptoms as listed below:  Observations/Objective: His most recent vital signs available for my review are from 09/07/2018: Blood pressure 129/79, pulse 69, weight 168.5 for a BMI of 24.18.  His most recent weight at home by self report is 168 pounds a couple of months ago, neck circumference by self report some 6 or 7 months ago when he was fitted for pursuit was 17 inches. He is pleasant, conversant, in no acute distress. Face is symmetric, normal facial animation noted. Extraocular movements are well preserved. Speech is clear without dysarthria, hypophonia or voice tremor noted. Comprehension is good. Airway examination reveals moderately crowded airway secondary to longer uvula, wider tongue.  Smaller airway entry noted as well. Tonsils are small. Mallampati is class I. Hearing is grossly intact. Neck movements are preserved, shoulder height normal and equal.  Assessment and Plan: Eduardo Moreno is a 43 year old right-handed gentleman with an underlying medical history of seizure disorder, with whom I am conducting a virtual, video based new patient visit via Webex in lieu of a face-to-face visit for evaluation of an underlying organic sleep disorder. He feels like sleep apnea is not a big concern as far as his symptoms, his main concern is that he does not sleep very well, has trouble going to sleep. He would prefer a laboratory attended sleep study. I explained to him that currently we are not conducting any in lab studies because of the virus pandemic. We mutually agreed to wait till we opened up our sleep  lab again and try to get him in for an laboratory attended sleep study. In the interim, he is encouraged to try to use melatonin again, he is advised to use it ahead of bedtime, about 1-2 hours before bedtime, he can retry the 3 mg strength. More is not always better he is reminded. Also, we talked about the importance of good sleep hygiene and healthy sleep environment, he is discouraged from using his television in his bedroom. He is advised to follow-up routinely as planned with Dr. Terrace ArabiaYan. I recommended the following at this time: sleep study, when possible.   I explained the sleep test procedure to the patient and also Explained how a home sleep test is more of a screening tool for obstructive sleep apnea. Plan to see him back after sleep study testing is completed. I answered all his questions today and he was in agreement.  Eduardo FoleySaima Illya Gienger, MD, PhD  Follow Up Instructions: 1. PSG.  2. Keep FU with Dr. Terrace ArabiaYan. 3. FU with me planned for post sleep testing.  I discussed the assessment and treatment plan with the patient. The patient was provided an opportunity to ask questions and all  were answered. The patient agreed with the plan and demonstrated an understanding of the instructions.   The patient was advised to call back or seek an in-person evaluation if the symptoms worsen or if the condition fails to improve as anticipated.  I provided 20 minutes of non-face-to-face time during this encounter.   Eduardo FoleySaima Aniza Shor, MD

## 2018-11-08 NOTE — Patient Instructions (Signed)
Given verbally, during today's virtual video-based encounter, with verbal feedback received.   

## 2018-11-14 ENCOUNTER — Telehealth: Payer: Self-pay | Admitting: Neurology

## 2018-11-14 NOTE — Telephone Encounter (Signed)
I called pt today to get his insurance information to get authorization for him to have a sleep study. Pt's voicemail was full.

## 2018-12-18 ENCOUNTER — Other Ambulatory Visit: Payer: Self-pay

## 2018-12-18 ENCOUNTER — Telehealth: Payer: Self-pay

## 2018-12-18 NOTE — Telephone Encounter (Signed)
We have attempted to call the patient two times to schedule sleep study.  Patient has been unavailable at the phone numbers we have on file and has not returned our calls.  At this point we will send a letter asking patient to please contact the sleep lab to schedule their sleep study.  If patient calls back we will schedule them for their sleep study. 

## 2019-02-20 ENCOUNTER — Ambulatory Visit (INDEPENDENT_AMBULATORY_CARE_PROVIDER_SITE_OTHER): Payer: Self-pay | Admitting: Neurology

## 2019-02-20 DIAGNOSIS — G40309 Generalized idiopathic epilepsy and epileptic syndromes, not intractable, without status epilepticus: Secondary | ICD-10-CM

## 2019-02-20 DIAGNOSIS — G479 Sleep disorder, unspecified: Secondary | ICD-10-CM

## 2019-02-20 DIAGNOSIS — G4720 Circadian rhythm sleep disorder, unspecified type: Secondary | ICD-10-CM

## 2019-02-20 DIAGNOSIS — G4700 Insomnia, unspecified: Secondary | ICD-10-CM

## 2019-02-20 DIAGNOSIS — G478 Other sleep disorders: Secondary | ICD-10-CM

## 2019-02-27 ENCOUNTER — Telehealth: Payer: Self-pay

## 2019-02-27 NOTE — Telephone Encounter (Signed)
-----   Message from Star Age, MD sent at 02/27/2019  8:23 AM EDT ----- Patient referred by Dr. Krista Blue, seen by me on 11/08/18 in VV, diagnostic PSG on 02/20/19.   Please call and notify the patient that the recent sleep study did not show any significant obstructive sleep apnea. He did have difficulty falling asleep.  He can try Melatonin at night for sleep: take 1 mg to 3 mg, one to 2 hours before your bedtime. He can go up to 5 mg if needed. It is over the counter and comes in pill form, chewable form and spray.   He can FU with Dr. Krista Blue as scheduled.  Please remind patient to try to maintain good sleep hygiene, which means: Keep a regular sleep and wake schedule and make enough time for sleep (7 1/2 to 8 1/2 hours for the average adult), try not to exercise or have a meal within 2 hours of your bedtime, try to keep your bedroom conducive for sleep, that is, cool and dark, without light distractors such as an illuminated alarm clock, and refrain from watching TV right before sleep or in the middle of the night and do not keep the TV or radio on during the night. If a nightlight is used, have it away from the visual field. Also, try not to use or play on electronic devices at bedtime, such as your cell phone, tablet PC or laptop. If you like to read at bedtime on an electronic device, try to dim the background light as much as possible. Do not eat in the middle of the night. Keep pets away from the bedroom environment. For stress relief, try meditation, deep breathing exercises (there are many books and CDs available), a white noise machine or fan can help to diffuse other noise distractors, such as traffic noise. Do not drink alcohol before bedtime, as it can disturb sleep and cause middle of the night awakenings. Never mix alcohol and sedating medications! Avoid narcotic pain medication close to bedtime, as opioids/narcotics can suppress breathing drive and breathing effort.    Thanks,  Star Age, MD,  PhD [facility])

## 2019-02-27 NOTE — Progress Notes (Signed)
Patient referred by Dr. Krista Blue, seen by me on 11/08/18 in VV, diagnostic PSG on 02/20/19.   Please call and notify the patient that the recent sleep study did not show any significant obstructive sleep apnea. He did have difficulty falling asleep.  He can try Melatonin at night for sleep: take 1 mg to 3 mg, one to 2 hours before your bedtime. He can go up to 5 mg if needed. It is over the counter and comes in pill form, chewable form and spray.   He can FU with Dr. Krista Blue as scheduled.  Please remind patient to try to maintain good sleep hygiene, which means: Keep a regular sleep and wake schedule and make enough time for sleep (7 1/2 to 8 1/2 hours for the average adult), try not to exercise or have a meal within 2 hours of your bedtime, try to keep your bedroom conducive for sleep, that is, cool and dark, without light distractors such as an illuminated alarm clock, and refrain from watching TV right before sleep or in the middle of the night and do not keep the TV or radio on during the night. If a nightlight is used, have it away from the visual field. Also, try not to use or play on electronic devices at bedtime, such as your cell phone, tablet PC or laptop. If you like to read at bedtime on an electronic device, try to dim the background light as much as possible. Do not eat in the middle of the night. Keep pets away from the bedroom environment. For stress relief, try meditation, deep breathing exercises (there are many books and CDs available), a white noise machine or fan can help to diffuse other noise distractors, such as traffic noise. Do not drink alcohol before bedtime, as it can disturb sleep and cause middle of the night awakenings. Never mix alcohol and sedating medications! Avoid narcotic pain medication close to bedtime, as opioids/narcotics can suppress breathing drive and breathing effort.    Thanks,  Star Age, MD, PhD [facility])

## 2019-02-27 NOTE — Procedures (Signed)
PATIENT'S NAME:  Eduardo Moreno, [name] DOB:      1976/02/21      MR#:    [id number]     DATE OF RECORDING: 02/20/2019 REFERRING M.D.:  Dr. Levert FeinsteinYijun Yan  Study Performed:   Baseline Polysomnogram HISTORY: 43 year old man with a history of seizure disorder, who reports snoring and difficulty with sleep initiation and sleep maintenance. The patient endorsed the Epworth Sleepiness Scale at 9 points. The patient's weight 168 pounds with a height of 70 (inches), resulting in a BMI of 24. kg/m2. The patient's neck circumference measured 17 inches.  CURRENT MEDICATIONS: Tegretol   PROCEDURE:  This is a multichannel digital polysomnogram utilizing the Somnostar 11.2 system.  Electrodes and sensors were applied and monitored per AASM Specifications.   EEG, EOG, Chin and Limb EMG, were sampled at 200 Hz.  ECG, Snore and Nasal Pressure, Thermal Airflow, Respiratory Effort, CPAP Flow and Pressure, Oximetry was sampled at 50 Hz. Digital video and audio were recorded.      BASELINE STUDY  Lights Out was at 20:55 and Lights On at 05:00.  Total recording time (TRT) was 485.5 minutes, with a total sleep time (TST) of 329 minutes.   The patient's sleep latency was 121 minutes, which is delayed.  REM latency was 92.5 minutes.  The sleep efficiency was 67.8 %.     SLEEP ARCHITECTURE: WASO (Wake after sleep onset) was 35 minutes, with moderate sleep fragmentation noted. There were 39.5 minutes in Stage N1, 161.5 minutes Stage N2, 90 minutes Stage N3 and 38 minutes in Stage REM.  The percentage of Stage N1 was 12.%, which is increased, Stage N2 was 49.1%, which is normal, Stage N3 was 27.4% and Stage R (REM sleep) was 11.6%, which is reduced.   RESPIRATORY ANALYSIS:  There were a total of 0 respiratory events:  0 obstructive apneas, 0 central apneas and 0 mixed apneas with a total of 0 apneas and an apnea index (AI) of 0 /hour. There were 0 hypopneas with a hypopnea index of 0 /hour. The patient also had 0 respiratory event  related arousals (RERAs).      The total APNEA/HYPOPNEA INDEX (AHI) was 0 /hour and the total RESPIRATORY DISTURBANCE INDEX was 0. 0 /hour.  0 events occurred in REM sleep and 0 events in NREM. The REM AHI was 0 /hour, versus a non-REM AHI of 0. The patient spent 23 minutes of total sleep time in the supine position and 306 minutes in non-supine.. The supine AHI was 0.0 versus a non-supine AHI of 0.0.  OXYGEN SATURATION & C02:  The Wake baseline 02 saturation was 94%, with the lowest being 81% (which appears to be likely due to a dislodged O2 sensor). Time spent below 89% saturation equaled 1 minutes.   PERIODIC LIMB MOVEMENTS: The patient had a total of 0 Periodic Limb Movements.  The Periodic Limb Movement (PLM) index was 0 and the PLM Arousal index was 0/hour. The arousals were noted as: 61 were spontaneous, 0 were associated with PLMs, 0 were associated with respiratory events.  Audio and video analysis did not show any abnormal or unusual movements, behaviors, phonations or vocalizations. The patient took no bathroom breaks. No significant snoring was noted. The EKG was in keeping with normal sinus rhythm (NSR).  Post-study, the patient indicated that sleep was the same as usual.   IMPRESSION:  1. Sleep disturbance 2. Dysfunctions associated with sleep stages or arousal from sleep  RECOMMENDATIONS:  1. This study does not demonstrate any significant  obstructive or central sleep disordered breathing. This study does not support an intrinsic sleep disorder as a cause of the patient's symptoms. Other causes, including circadian rhythm disturbances, an underlying mood disorder, medication effect and/or an underlying medical problem cannot be ruled out. 2. This study shows sleep fragmentation and abnormal sleep stage percentages; these are nonspecific findings and per se do not signify an intrinsic sleep disorder or a cause for the patient's sleep-related symptoms. Causes include (but are not  limited to) the first night effect of the sleep study, circadian rhythm disturbances, medication effect or an underlying mood disorder or medical problem.  3. The patient should be cautioned not to drive, work at heights, or operate dangerous or heavy equipment when tired or sleepy. Review and reiteration of good sleep hygiene measures should be pursued with any patient. 4. The patient will be advised to follow up with the referring provider, who will be notified of the test results.  I certify that I have reviewed the entire raw data recording prior to the issuance of this report in accordance with the Standards of Accreditation of the American Academy of Sleep Medicine (AASM)    Star Age, MD, PhD Diplomat, American Board of Neurology and Sleep Medicine (Neurology and Sleep Medicine)

## 2019-02-27 NOTE — Telephone Encounter (Signed)
I called pt. I advised pt that Dr. Rexene Alberts reviewed pt's sleep study and found that pt did not show nay significant osa but did have trouble falling asleep. Dr. Rexene Alberts recommends that pt try melatonin OTC for trouble falling asleep. I reviewed sleep hygiene recommendations with the pt, including trying to keep a regular sleep wake schedule, avoiding electronics in the bedroom, keeping the bedroom cool, dark, and quiet, and avoiding eating or exercising within 2 hours of bedtime as well as eating in the middle of the night. I advised pt to keep pets out of the bedroom. I discussed with pt the importance of stress relief and to try meditation, deep breathing exercises, and/or a white noise machine or fan to diffuse other noise distractors. I advised pt to not drink alcohol before bedtime and to never mix alcohol and sedating medications. Pt was advised to avoid narcotic pain medication close to bedtime. I advised pt that a copy of these sleep study results will be sent to Dr. Krista Blue. Pt verbalized understanding of results. Pt had no questions at this time but was encouraged to call back if questions arise.

## 2020-03-04 ENCOUNTER — Encounter (HOSPITAL_COMMUNITY): Payer: Self-pay | Admitting: Emergency Medicine

## 2020-03-04 ENCOUNTER — Other Ambulatory Visit: Payer: Self-pay

## 2020-03-04 ENCOUNTER — Emergency Department (HOSPITAL_COMMUNITY): Payer: Medicaid Other

## 2020-03-04 ENCOUNTER — Emergency Department (HOSPITAL_COMMUNITY)
Admission: EM | Admit: 2020-03-04 | Discharge: 2020-03-05 | Disposition: A | Discharge status: Home / Self Care | Payer: Medicaid Other | Attending: Emergency Medicine | Admitting: Emergency Medicine

## 2020-03-04 DIAGNOSIS — R000 Tachycardia, unspecified: Secondary | ICD-10-CM | POA: Insufficient documentation

## 2020-03-04 DIAGNOSIS — R0602 Shortness of breath: Secondary | ICD-10-CM | POA: Diagnosis not present

## 2020-03-04 DIAGNOSIS — R079 Chest pain, unspecified: Secondary | ICD-10-CM | POA: Diagnosis not present

## 2020-03-04 DIAGNOSIS — R002 Palpitations: Secondary | ICD-10-CM | POA: Insufficient documentation

## 2020-03-04 LAB — BASIC METABOLIC PANEL
Anion gap: 9 (ref 5–15)
BUN: 8 mg/dL (ref 6–20)
CO2: 22 mmol/L (ref 22–32)
Calcium: 8.9 mg/dL (ref 8.9–10.3)
Chloride: 106 mmol/L (ref 98–111)
Creatinine, Ser: 1.18 mg/dL (ref 0.61–1.24)
GFR calc Af Amer: >60 mL/min (ref >60)
GFR calc non Af Amer: >60 mL/min (ref >60)
Glucose, Bld: 120 mg/dL — ABNORMAL HIGH (ref 70–99)
Potassium: 4 mmol/L (ref 3.5–5.1)
Sodium: 137 mmol/L (ref 135–145)

## 2020-03-04 LAB — CBC
HCT: 47.2 % (ref 39.0–52.0)
Hemoglobin: 16.5 g/dL (ref 13.0–17.0)
MCH: 31.8 pg (ref 26.0–34.0)
MCHC: 35 g/dL (ref 30.0–36.0)
MCV: 90.9 fL (ref 80.0–100.0)
Platelets: 242 10*3/uL (ref 150–400)
RBC: 5.19 MIL/uL (ref 4.22–5.81)
RDW: 11.6 % (ref 11.5–15.5)
WBC: 7.2 10*3/uL (ref 4.0–10.5)
nRBC: 0 % (ref 0.0–0.2)

## 2020-03-04 LAB — TROPONIN I (HIGH SENSITIVITY)
Troponin I (High Sensitivity): 4 ng/L (ref <18)
Troponin I (High Sensitivity): 14 ng/L (ref <18)

## 2020-03-04 NOTE — ED Triage Notes (Addendum)
Patient arrives to ED by POV with complaints of tachycardia/palpations. Per pt he was riding in his car when he suddenly became short of breath and his chest started to get tight. Pt stated that his heart started racing and he came to ED. Denies drug use. CP free at this time.

## 2020-03-05 NOTE — Discharge Instructions (Signed)
Your labwork and imaging were very reassuring today Please keep appointment as scheduled with your cardiologist in 2 days time.   Return to the ED for any worsening symptoms including recurrence of chest pain, worsening palpitations/shortness of breath, passing out, leg swelling, or any other new/concerning symptoms

## 2020-03-05 NOTE — ED Notes (Signed)
Patient given discharge instructions. Questions were answered. Patient verbalized understanding of discharge instructions and care at home.  
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
## 2020-11-12 ENCOUNTER — Encounter: Payer: Self-pay | Admitting: *Deleted

## 2020-11-13 ENCOUNTER — Institutional Professional Consult (permissible substitution): Payer: Medicaid Other | Admitting: Neurology

## 2021-01-30 ENCOUNTER — Other Ambulatory Visit: Payer: Self-pay

## 2021-01-30 ENCOUNTER — Encounter: Payer: Self-pay | Admitting: Neurology

## 2021-01-30 ENCOUNTER — Ambulatory Visit: Payer: Medicaid Other | Admitting: Neurology

## 2021-01-30 VITALS — BP 124/80 | HR 65 | Ht 70.0 in | Wt 193.3 lb

## 2021-01-30 DIAGNOSIS — R42 Dizziness and giddiness: Secondary | ICD-10-CM | POA: Diagnosis not present

## 2021-01-30 DIAGNOSIS — G40309 Generalized idiopathic epilepsy and epileptic syndromes, not intractable, without status epilepticus: Secondary | ICD-10-CM | POA: Diagnosis not present
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
## 2021-02-02 ENCOUNTER — Telehealth: Payer: Self-pay | Admitting: Neurology
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
## 2021-02-17 IMAGING — CR DG CHEST 2V
2 series · 2 of 2 positions shown · non-contrast
Comparison: 01/03/2019

CLINICAL DATA: Chest pain

EXAM:
CHEST - 2 VIEW

[chest pa]
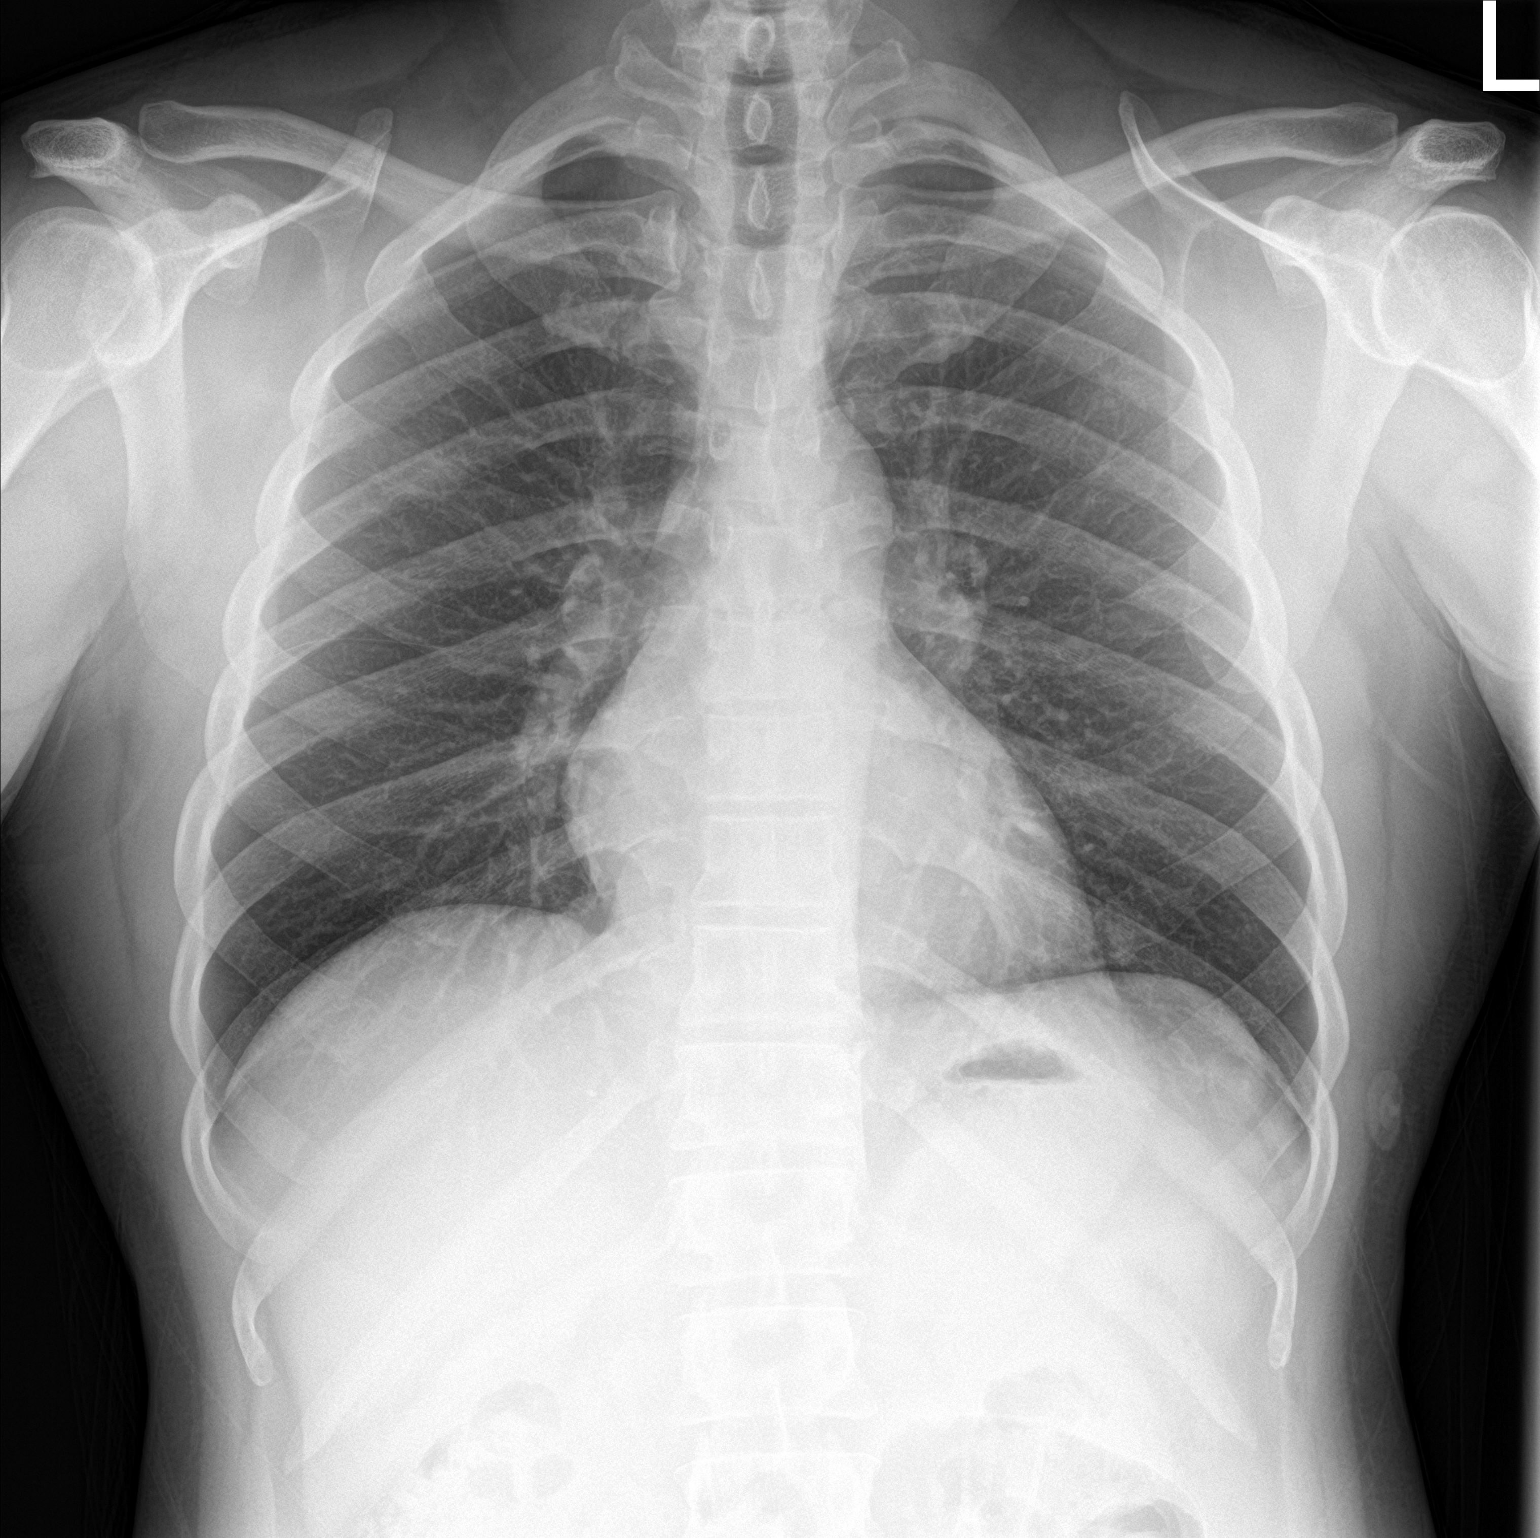

[chest lat]
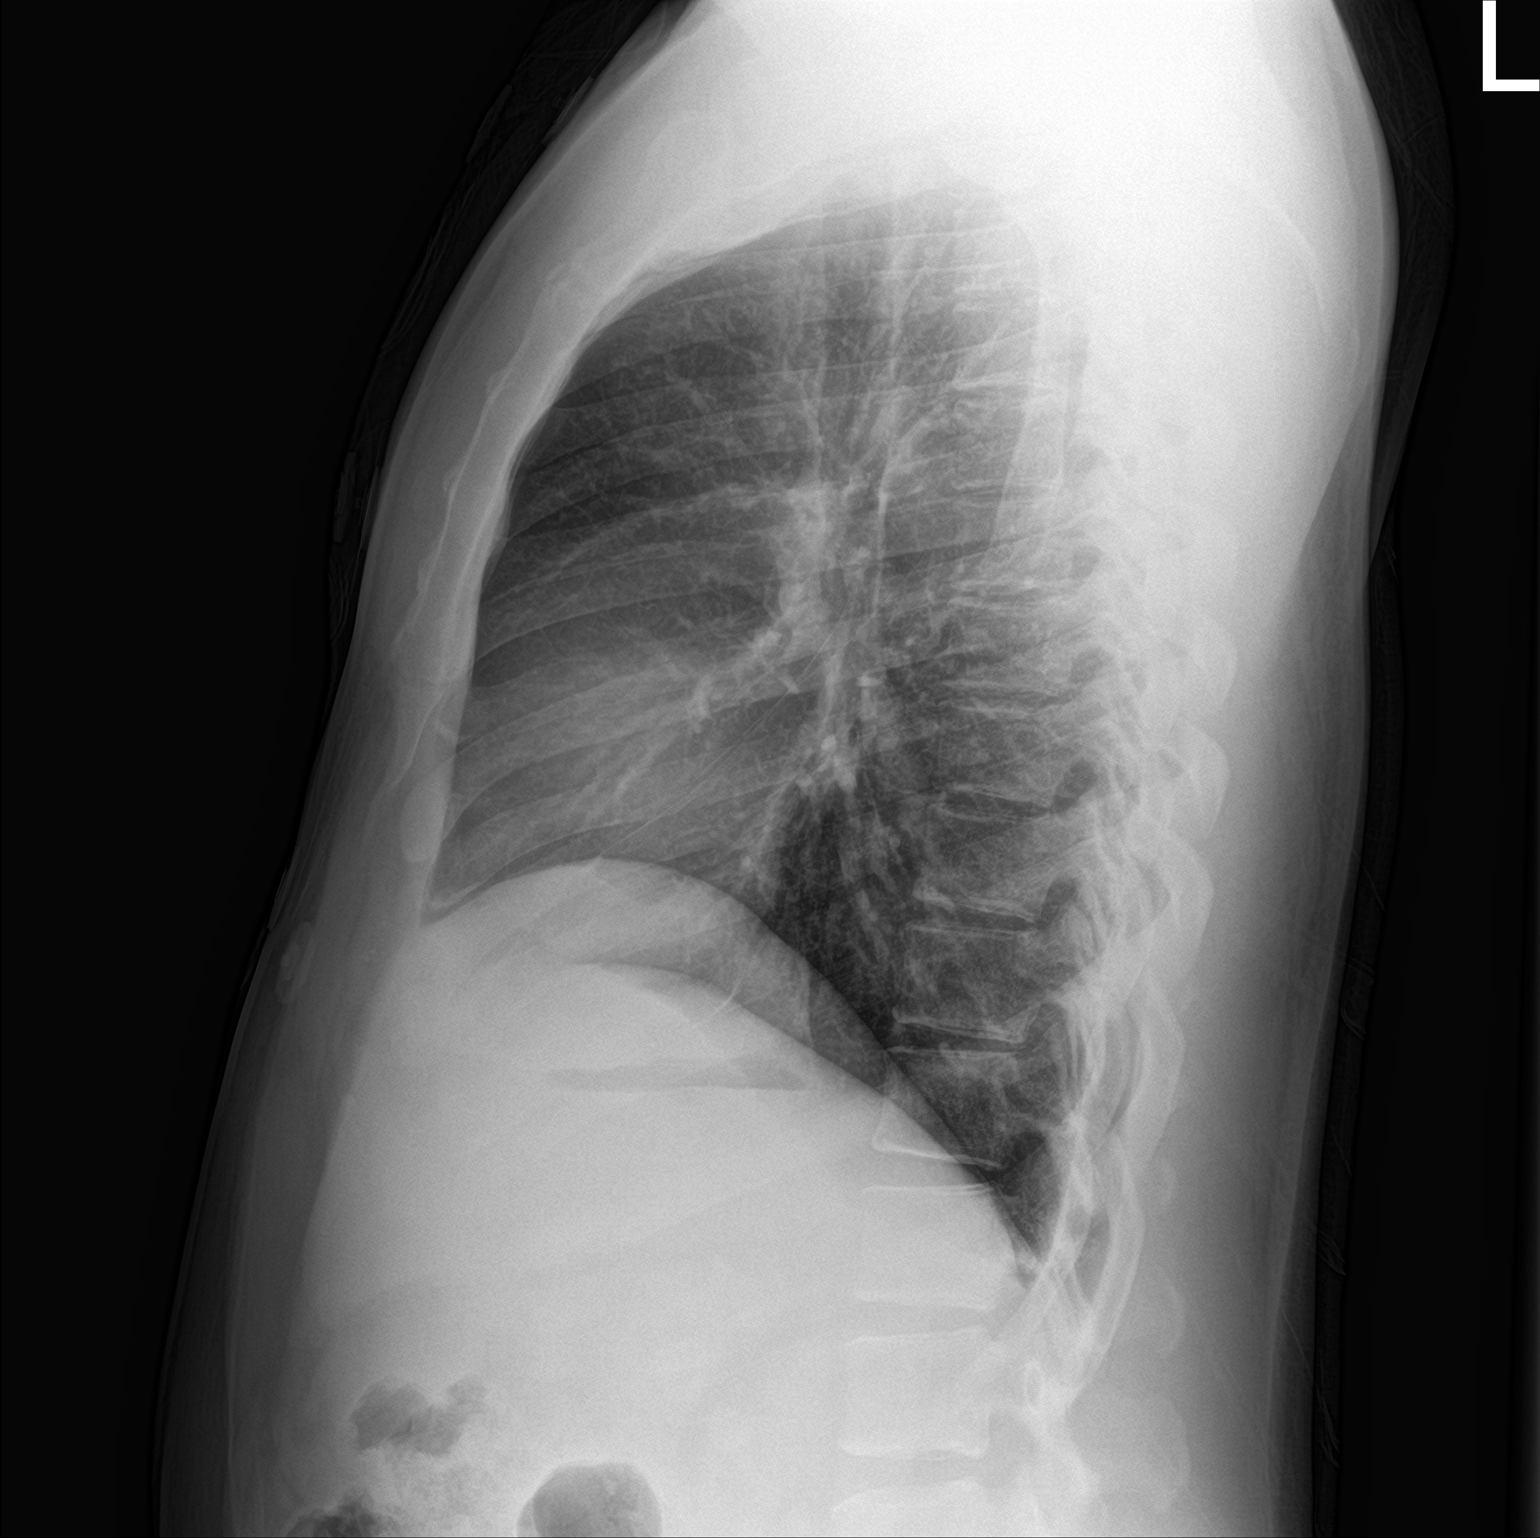

[2 of 2 positions shown; findings below may reference images not displayed]

FINDINGS: The heart size and mediastinal contours are within normal limits.
Both lungs are clear. The visualized skeletal structures are
unremarkable.
IMPRESSION: Negative.

## 2023-03-28 ENCOUNTER — Telehealth: Payer: Self-pay

## 2023-03-28 NOTE — Telephone Encounter (Signed)
LVM for patient to call back [phone], or to call PCP office to schedule follow up apt. AS, CMA
